# Patient Record
Sex: Male | Born: 1975 | Race: White | Hispanic: No | Marital: Married | Smoking: Former smoker
Health system: Southern US, Community
[De-identification: ages and names within clinical notes are randomized; demographics above are authoritative.]

## PROBLEM LIST (undated history)

## (undated) DIAGNOSIS — F419 Anxiety disorder, unspecified: Secondary | ICD-10-CM

## (undated) DIAGNOSIS — R030 Elevated blood-pressure reading, without diagnosis of hypertension: Secondary | ICD-10-CM

## (undated) DIAGNOSIS — E669 Obesity, unspecified: Secondary | ICD-10-CM

## (undated) DIAGNOSIS — I10 Essential (primary) hypertension: Secondary | ICD-10-CM

## (undated) DIAGNOSIS — F988 Other specified behavioral and emotional disorders with onset usually occurring in childhood and adolescence: Secondary | ICD-10-CM

## (undated) DIAGNOSIS — K219 Gastro-esophageal reflux disease without esophagitis: Secondary | ICD-10-CM

## (undated) DIAGNOSIS — F32A Depression, unspecified: Secondary | ICD-10-CM

## (undated) DIAGNOSIS — F329 Major depressive disorder, single episode, unspecified: Secondary | ICD-10-CM

## (undated) HISTORY — DX: Essential (primary) hypertension: I10

## (undated) HISTORY — DX: Depression, unspecified: F32.A

## (undated) HISTORY — DX: Other specified behavioral and emotional disorders with onset usually occurring in childhood and adolescence: F98.8

## (undated) HISTORY — DX: Obesity, unspecified: E66.9

## (undated) HISTORY — PX: TONSILLECTOMY AND ADENOIDECTOMY: SUR1326

## (undated) HISTORY — PX: ANKLE SURGERY: SHX546

## (undated) HISTORY — DX: Elevated blood-pressure reading, without diagnosis of hypertension: R03.0

## (undated) HISTORY — DX: Major depressive disorder, single episode, unspecified: F32.9

## (undated) HISTORY — DX: Anxiety disorder, unspecified: F41.9

## (undated) HISTORY — DX: Gastro-esophageal reflux disease without esophagitis: K21.9

---

## 2006-04-23 ENCOUNTER — Ambulatory Visit: Payer: Self-pay | Admitting: Family Medicine

## 2007-01-17 ENCOUNTER — Ambulatory Visit: Payer: Self-pay | Admitting: Family Medicine

## 2007-05-05 ENCOUNTER — Ambulatory Visit: Payer: Self-pay | Admitting: Internal Medicine

## 2008-02-01 DIAGNOSIS — J309 Allergic rhinitis, unspecified: Secondary | ICD-10-CM | POA: Insufficient documentation

## 2008-02-01 DIAGNOSIS — F988 Other specified behavioral and emotional disorders with onset usually occurring in childhood and adolescence: Secondary | ICD-10-CM | POA: Insufficient documentation

## 2008-02-01 DIAGNOSIS — J45909 Unspecified asthma, uncomplicated: Secondary | ICD-10-CM | POA: Insufficient documentation

## 2008-02-02 ENCOUNTER — Ambulatory Visit: Payer: Self-pay | Admitting: Family Medicine

## 2008-03-01 ENCOUNTER — Ambulatory Visit: Payer: Self-pay | Admitting: Family Medicine

## 2008-03-04 LAB — CONVERTED CEMR LAB
CO2: 32 meq/L (ref 19–32)
Calcium: 9.5 mg/dL (ref 8.4–10.5)
Eosinophils Absolute: 0.1 10*3/uL (ref 0.0–0.6)
Eosinophils Relative: 1.5 % (ref 0.0–5.0)
Glucose, Bld: 84 mg/dL (ref 70–99)
HCT: 48 % (ref 39.0–52.0)
Lymphocytes Relative: 33.5 % (ref 12.0–46.0)
MCV: 86.1 fL (ref 78.0–100.0)
Neutro Abs: 3.4 10*3/uL (ref 1.4–7.7)
Neutrophils Relative %: 57.3 % (ref 43.0–77.0)
Potassium: 4.3 meq/L (ref 3.5–5.1)
RBC: 5.58 M/uL (ref 4.22–5.81)
TSH: 1.43 microintl units/mL (ref 0.35–5.50)
WBC: 5.8 10*3/uL (ref 4.5–10.5)

## 2009-02-25 ENCOUNTER — Telehealth: Payer: Self-pay | Admitting: Family Medicine

## 2009-07-31 ENCOUNTER — Telehealth: Payer: Self-pay | Admitting: Family Medicine

## 2010-01-03 ENCOUNTER — Telehealth: Payer: Self-pay | Admitting: Family Medicine

## 2010-02-11 ENCOUNTER — Ambulatory Visit: Payer: Self-pay | Admitting: Family Medicine

## 2010-02-11 DIAGNOSIS — R030 Elevated blood-pressure reading, without diagnosis of hypertension: Secondary | ICD-10-CM | POA: Insufficient documentation

## 2010-02-14 LAB — CONVERTED CEMR LAB
Albumin: 4.6 g/dL (ref 3.5–5.2)
Alkaline Phosphatase: 85 units/L (ref 39–117)
BUN: 19 mg/dL (ref 6–23)
Basophils Absolute: 0.1 10*3/uL (ref 0.0–0.1)
Bilirubin, Direct: 0.2 mg/dL (ref 0.0–0.3)
Cholesterol: 187 mg/dL (ref 0–200)
Creatinine, Ser: 1.3 mg/dL (ref 0.4–1.5)
Eosinophils Absolute: 0.2 10*3/uL (ref 0.0–0.7)
Glucose, Bld: 83 mg/dL (ref 70–99)
HDL: 35.6 mg/dL — ABNORMAL LOW (ref >39.00)
Lymphocytes Relative: 38.3 % (ref 12.0–46.0)
MCHC: 33.6 g/dL (ref 30.0–36.0)
Monocytes Absolute: 0.3 10*3/uL (ref 0.1–1.0)
Neutrophils Relative %: 51.7 % (ref 43.0–77.0)
Phosphorus: 4.7 mg/dL — ABNORMAL HIGH (ref 2.3–4.6)
Platelets: 200 10*3/uL (ref 150.0–400.0)
Potassium: 4.2 meq/L (ref 3.5–5.1)
RDW: 12.4 % (ref 11.5–14.6)
Triglycerides: 226 mg/dL — ABNORMAL HIGH (ref 0.0–149.0)
VLDL: 45.2 mg/dL — ABNORMAL HIGH (ref 0.0–40.0)

## 2010-03-11 ENCOUNTER — Ambulatory Visit: Payer: Self-pay | Admitting: Family Medicine

## 2010-09-15 ENCOUNTER — Ambulatory Visit: Payer: Self-pay | Admitting: Family Medicine

## 2010-11-17 ENCOUNTER — Ambulatory Visit: Payer: Self-pay | Admitting: General Practice

## 2011-01-27 NOTE — Assessment & Plan Note (Signed)
Summary: FOLLOW UP / LFW   Vital Signs:  Patient profile:   35 year old male Height:      71.5 inches Weight:      287.50 pounds BMI:     39.68 Temp:     98.4 degrees F oral Pulse rate:   68 / minute Pulse rhythm:   regular BP sitting:   128 / 84  (left arm) Cuff size:   large  Vitals Entered By: Lewanda Rife LPN (September 15, 2010 3:01 PM) CC: six month f/u   History of Present Illness: here for f/u of borderline bp and depression  wt is down 12 lb since last visit with bmi of 39 is really proud of it  started with the special K diet  was down a bit more and then gained a bit    had been exercising more- is trying to keep that up  is still doing that with more weights    on zoloft for depression- still working well with good mood and wants to stick with it  stress level is a bit better   128/84 blood pressure   Allergies: 1)  ! Lexapro  Past History:  Past Medical History: Last updated: 02/01/2008 Allergic rhinitis Asthma Depression  Past Surgical History: Last updated: 02/01/2008 Ankle surgery Adenoidectomy Tonsillectomy  Family History: Last updated: 03/01/2008 Father: heart problems, HTN Mother:  Siblings:  sister hypothyroid   Social History: Last updated: 02/01/2008 Marital Status: Married Children:  Occupation: [employer]  Risk Factors: Smoking Status: quit (02/01/2008)  Review of Systems General:  Denies fatigue, fever, loss of appetite, and malaise. Eyes:  Denies blurring and eye irritation. CV:  Denies chest pain or discomfort, lightheadness, and palpitations. Resp:  Denies cough and shortness of breath. GI:  Denies abdominal pain and change in bowel habits. GU:  Denies dysuria and urinary frequency. MS:  Denies joint pain and cramps. Derm:  Denies itching, lesion(s), poor wound healing, and rash. Neuro:  Denies numbness and tingling. Psych:  Denies anxiety and depression. Endo:  Denies excessive thirst and excessive  urination. Heme:  Denies abnormal bruising and bleeding.   Impression & Recommendations:  Problem # 1:  ELEVATED BLOOD PRESSURE WITHOUT DIAGNOSIS OF HYPERTENSION (ICD-796.2) Assessment Improved  this is very well controlled with wt loss and exercise urged to keep up the effort   BP today: 128/84 Prior BP: 128/92 (03/11/2010)  Labs Reviewed: Creat: 1.3 (02/11/2010) Chol: 187 (02/11/2010)   HDL: 35.60 (02/11/2010)   TG: 226.0 (02/11/2010)  Instructed in low sodium diet (DASH Handout) and behavior modification.    Problem # 2:  DEPRESSION (ICD-311) Assessment: Improved  doing great with long term zoloft without change  urged to stay active as well His updated medication list for this problem includes:    Zoloft 50 Mg Tabs (Sertraline hcl) .Marland Kitchen... 1 by mouth once daily  Orders: Prescription Created Electronically [phone])  Complete Medication List: 1)  Zoloft 50 Mg Tabs (Sertraline hcl) .Marland Kitchen.. 1 by mouth once daily 2)  Proventil Hfa 108 (90 Base) Mcg/act Aers (Albuterol sulfate) .... 2 puffs q 4 hours as needed wheeze 3)  Aspirin 81 Mg Tbec (Aspirin) .Marland Kitchen.. 1 by mouth once daily 4)  Advil 200 Mg Tabs (Ibuprofen) .... Otc as directed.  Patient Instructions: 1)  keep up the great work with diet and exercise--keep up the weight loss 2)  no change in medicine  3)  blood pressure is good  Prescriptions: PROVENTIL HFA 108 (90 BASE) MCG/ACT  AERS (  ALBUTEROL SULFATE) 2 puffs q 4 hours as needed wheeze  #3 mdi x 3   Entered and Authorized by:   Judith Part MD   Signed by:   Judith Part MD on 09/15/2010   Method used:   Print then Give to Patient   RxID:   [id number] ZOLOFT 50 MG  TABS (SERTRALINE HCL) 1 by mouth once daily  #90 x 3   Entered and Authorized by:   Judith Part MD   Signed by:   Judith Part MD on 09/15/2010   Method used:   Print then Give to Patient   RxID:   [id number]   Current Allergies (reviewed today): ! LEXAPRO

## 2011-01-27 NOTE — Progress Notes (Signed)
Summary: refill request for sertraline  Phone Note Refill Request Message from:  [employer]  Refills Requested: Medication #1:  ZOLOFT 50 MG  TABS 1 by mouth once daily   Last Refilled: 12/05/2009 Electronic refill request from [facility].  Initial call taken by: Lowella Petties CMA,  January 03, 2010 12:38 PM  Follow-up for Phone Call        need to schedule f/u this winter please  px written on EMR for call in  Follow-up by: Judith Part MD,  January 03, 2010 1:29 PM  Additional Follow-up for Phone Call Additional follow up Details #1::        [facility] advising pt that he needs to schedule appt. Additional Follow-up by: Lowella Petties CMA,  January 03, 2010 2:18 PM    Prescriptions: ZOLOFT 50 MG  TABS (SERTRALINE HCL) 1 by mouth once daily  #30 x 1   Entered and Authorized by:   Judith Part MD   Signed by:   Lowella Petties CMA on 01/03/2010   Method used:   Telephoned to ...         RxID:   [id number]

## 2011-01-27 NOTE — Assessment & Plan Note (Signed)
Summary: ONE MONTH F/U TO CK BP AGAIN / LFW   Vital Signs:  Patient profile:   35 year old male Height:      71.5 inches Weight:      299.75 pounds BMI:     41.37 Temp:     98.8 degrees F oral Pulse rate:   72 / minute Pulse rhythm:   regular BP sitting:   128 / 92  (left arm) Cuff size:   large  Vitals Entered By: Lewanda Rife LPN (March 11, 2010 3:17 PM)  Serial Vital Signs/Assessments:  Time      Position  BP       Pulse  Resp  Temp     By                     161/09                         Judith Part MD   History of Present Illness: here to check bp for elevation at last visit is doing well -- feeling good   lots of exercise  20 min of elliptical with sprints and then 20 with bike and jog 10  50 min of cardio 4-5 days per week  then does wts   taking notches down his belt - even though not much wt lost on scale  eating a lot more veg and fruit   wt is down 3 lb  bp today is improved at 128/92 on first check  did labs  chol mildly high with LDL 132  mood is very good   Allergies: 1)  ! Lexapro  Past History:  Past Medical History: Last updated: 02/01/2008 Allergic rhinitis Asthma Depression  Past Surgical History: Last updated: 02/01/2008 Ankle surgery Adenoidectomy Tonsillectomy  Family History: Last updated: 03/01/2008 Father: heart problems, HTN Mother:  Siblings:  sister hypothyroid   Social History: Last updated: 02/01/2008 Marital Status: Married Children:  Occupation: [employer]  Risk Factors: Smoking Status: quit (02/01/2008)  Review of Systems General:  Denies fatigue, loss of appetite, malaise, and sleep disorder. Eyes:  Denies blurring and eye irritation. CV:  Denies chest pain or discomfort, palpitations, and shortness of breath with exertion. Resp:  Denies cough and wheezing. GI:  Denies abdominal pain, bloody stools, change in bowel habits, indigestion, nausea, and vomiting. GU:  Denies urinary frequency. MS:  Denies  muscle aches. Derm:  Denies lesion(s), poor wound healing, and rash. Neuro:  Denies headaches, numbness, and tingling. Psych:  Denies anxiety and depression. Endo:  Denies cold intolerance and heat intolerance. Heme:  Denies abnormal bruising and bleeding.  Physical Exam  General:  overweight but generally well appearing  muscular build with some wt loss since last visit  Head:  normocephalic, atraumatic, and no abnormalities observed.   Eyes:  vision grossly intact, pupils equal, pupils round, and pupils reactive to light.  no conjunctival pallor, injection or icterus  Mouth:  pharynx pink and moist.   Neck:  supple with full rom and no masses or thyromegally, no JVD or carotid bruit  Lungs:  Normal respiratory effort, chest expands symmetrically. Lungs are clear to auscultation, no crackles or wheezes. Heart:  Normal rate and regular rhythm. S1 and S2 normal without gallop, murmur, click, rub or other extra sounds. Abdomen:  no renal bruits  Msk:  No deformity or scoliosis noted of thoracic or lumbar spine.   Extremities:  No clubbing, cyanosis, edema, or deformity  noted with normal full range of motion of all joints.   Neurologic:  sensation intact to light touch, gait normal, and DTRs symmetrical and normal.  no tremor  Skin:  Intact without suspicious lesions or rashes Cervical Nodes:  No lymphadenopathy noted Psych:  bright affect- talkative and cheerful today   Impression & Recommendations:  Problem # 1:  ELEVATED BLOOD PRESSURE WITHOUT DIAGNOSIS OF HYPERTENSION (ICD-796.2) Assessment Improved  improved and even better on second check today with large cuff at rest disc healthy diet (low simple sugar/ choose complex carbs/ low sat fat) diet and exercise in detail  commended on efforts and wt loss so far  f/u 6 mo   BP today: 128/92- re check 122/85 Prior BP: 138/92 (02/11/2010)  Labs Reviewed: Creat: 1.3 (02/11/2010) Chol: 187 (02/11/2010)   HDL: 35.60 (02/11/2010)    TG: 226.0 (02/11/2010)  Instructed in low sodium diet (DASH Handout) and behavior modification.    Problem # 2:  DEPRESSION (ICD-311) Assessment: Improved much imp with low dose zoloft and exercise will work to keep up good habits - update if worse  f/u 6 mo  His updated medication list for this problem includes:    Zoloft 50 Mg Tabs (Sertraline hcl) .Marland Kitchen... 1 by mouth once daily  Complete Medication List: 1)  Zoloft 50 Mg Tabs (Sertraline hcl) .Marland Kitchen.. 1 by mouth once daily 2)  Proventil Hfa 108 (90 Base) Mcg/act Aers (Albuterol sulfate) .... 2 puffs q 4 hours as needed wheeze 3)  Aspirin 81 Mg Tbec (Aspirin) .Marland Kitchen.. 1 by mouth once daily  Patient Instructions: 1)  keep working on healthy diet and weight loss 2)  blood pressure is better today  3)  watch sodium in diet  4)  follow up with me in about 6 months  Current Allergies (reviewed today): ! LEXAPRO

## 2011-01-27 NOTE — Assessment & Plan Note (Signed)
Summary: FOLLOW UP ON MEDS/[facility]H   Vital Signs:  Patient profile:   35 year old male Height:      71.5 inches Weight:      302.50 pounds BMI:     41.75 Temp:     97.9 degrees F oral Pulse rate:   76 / minute Pulse rhythm:   regular BP sitting:   138 / 92  (left arm) Cuff size:   large  Vitals Entered By: Lewanda Rife LPN (February 11, 2010 3:21 PM)  Serial Vital Signs/Assessments:  Time      Position  BP       Pulse  Resp  Temp     By                     138/90                         Judith Part MD   History of Present Illness: here for f/u of mood disorder  wt is up 20 lb this visit   wife and self got laid off -- this year -- was really hard now have new jobs   he was working at [employer]  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 if it is stress related   works out 4-5 days per week for 11/2 hours  doing machines - in a circuit / rides the bike 20 min / bike 10 / elliptical for 10 (not high intensity)   breakfast - protien bar  lunch - can of soup and pbj sandwhich supper - chicken or tacos   (bigger serving for supper)  snack before 8 am -- sugar free popcicles  1-2 gallons of water per day   no sweets or junk or fast food or pizza  no fruit  starting to eat vegetables -- broccoli / calliflauer and squash   zoloft is working very well - wants to stay on it  not a lot of side effects     Allergies: 1)  ! Lexapro  Past History:  Past Medical History: Last updated: 02/01/2008 Allergic rhinitis Asthma Depression  Past Surgical History: Last updated: 02/01/2008 Ankle surgery Adenoidectomy Tonsillectomy  Family History: Last updated: 03/01/2008 Father: heart problems, HTN Mother:  Siblings:  sister hypothyroid   Social History: Last updated: 02/01/2008 Marital Status: Married Children:  Occupation: [employer]  Risk Factors: Smoking Status: quit  (02/01/2008)  Review of Systems General:  Complains of fatigue; denies loss of appetite, malaise, and sleep disorder. Eyes:  Denies blurring and eye irritation. CV:  Denies chest pain or discomfort, lightheadness, and palpitations. Resp:  Denies cough and wheezing. GI:  Denies abdominal pain, change in bowel habits, and indigestion. GU:  Denies dysuria, nocturia, urinary frequency, and urinary hesitancy. MS:  Denies joint pain, joint redness, and joint swelling. Derm:  Denies itching, lesion(s), poor wound healing, and rash. Neuro:  Denies numbness and tingling. Psych:  Denies anxiety and depression. Endo:  Denies cold intolerance, excessive thirst, excessive urination, and heat intolerance. Heme:  Denies abnormal bruising and bleeding.  Physical Exam  General:  overweight but generally well appearing wt gain noted very muscular build  Head:  normocephalic, atraumatic, and no abnormalities observed.   Eyes:  vision grossly intact, pupils equal, pupils round, and pupils reactive to light.  no conjunctival pallor, injection or icterus  Mouth:  pharynx pink and moist.   Neck:  supple with full rom and no masses or thyromegally, no JVD or carotid bruit  Chest Wall:  No deformities, masses, tenderness or gynecomastia noted. Lungs:  Normal respiratory effort, chest expands symmetrically. Lungs are clear to auscultation, no crackles or wheezes. Heart:  Normal rate and regular rhythm. S1 and S2 normal without gallop, murmur, click, rub or other extra sounds. Abdomen:  Bowel sounds positive,abdomen soft and non-tender without masses, organomegaly or hernias noted. no renal bruits  Msk:  No deformity or scoliosis noted of thoracic or lumbar spine.  no acute joint changes  Pulses:  R and L carotid,radial,femoral,dorsalis pedis and posterior tibial pulses are full and equal bilaterally Extremities:  No clubbing, cyanosis, edema, or deformity noted with normal full range of motion of all joints.     Neurologic:  sensation intact to light touch, gait normal, and DTRs symmetrical and normal.  no tremor  Skin:  Intact without suspicious lesions or rashes Cervical Nodes:  No lymphadenopathy noted Psych:  normal affect, talkative and pleasant    Impression & Recommendations:  Problem # 1:  DEPRESSION (ICD-311) Assessment Improved  very well controlled with zoloft - despite ups and downs with stress refilled and sent to pharmacy His updated medication list for this problem includes:    Zoloft 50 Mg Tabs (Sertraline hcl) .Marland Kitchen... 1 by mouth once daily  Orders: Prescription Created Electronically [phone])  Problem # 2:  ELEVATED BLOOD PRESSURE WITHOUT DIAGNOSIS OF HYPERTENSION (ICD-796.2) Assessment: New along with wt gain in pt with family hx  disc lifestyle changes in detail handouts given from [facility] on HTN  will work on exercise/wt loss  f/u 1 mo  consider tx if not imp lab today Orders: Venipuncture (98119) TLB-Lipid Panel (80061-LIPID) TLB-Renal Function Panel (80069-RENAL) TLB-CBC Platelet - w/Differential (85025-CBCD) TLB-Hepatic/Liver Function Pnl (80076-HEPATIC) TLB-TSH (Thyroid Stimulating Hormone) (84443-TSH)  Complete Medication List: 1)  Zoloft 50 Mg Tabs (Sertraline hcl) .Marland Kitchen.. 1 by mouth once daily 2)  Proventil Hfa 108 (90 Base) Mcg/act Aers (Albuterol sulfate) .... 2 puffs q 4 hours as needed wheeze 3)  Aspirin 81 Mg Tbec (Aspirin) .Marland Kitchen.. 1 by mouth once daily  Patient Instructions: 1)  work on weight loss - consider online weight watchers for men or nutra system  2)  increase intensity of you cardio for at least 30 min  3)  watch sodium in your diet- avoid processed foods  4)  labs today  5)  no change in zoloft  6)  follow up with me in 1 month - to check blood pressure again  Prescriptions: ZOLOFT 50 MG  TABS (SERTRALINE HCL) 1 by mouth once daily  #30 x 11   Entered and Authorized by:   Judith Part MD   Signed by:   Judith Part MD on 02/11/2010    Method used:   Electronically to        [facility]       [address]       [place], [place]  [address]       Ph: [phone]       Fax: [phone]   RxID:   [id number]   Current Allergies (reviewed today): ! LEXAPRO

## 2011-09-15 ENCOUNTER — Other Ambulatory Visit: Payer: Self-pay

## 2011-09-15 MED ORDER — SERTRALINE HCL 50 MG PO TABS: 50.0000 mg | ORAL_TABLET | Freq: Every day | ORAL | Status: DC

## 2011-09-15 NOTE — Telephone Encounter (Signed)
[facility] pharmacy faxed refill request Sertraline 50mg  taking 1 tab daily. Was in centricity and I added to Epic med list. Last refill 06/10/11.Please advise. [facility] pharmacy phone [phone] and fax [phone].

## 2011-09-15 NOTE — Telephone Encounter (Signed)
Medication phoned to [facility] pharmacy as instructed with note pt to call for f/u this fall.

## 2011-09-15 NOTE — Telephone Encounter (Signed)
Schedule f/u this fall Px written for call in

## 2011-11-13 ENCOUNTER — Encounter: Payer: Self-pay | Admitting: Family Medicine

## 2011-11-16 ENCOUNTER — Encounter: Payer: Self-pay | Admitting: Family Medicine

## 2011-11-16 ENCOUNTER — Ambulatory Visit (INDEPENDENT_AMBULATORY_CARE_PROVIDER_SITE_OTHER): Payer: PRIVATE HEALTH INSURANCE | Admitting: Family Medicine

## 2011-11-16 VITALS — BP 134/88 | HR 68 | Temp 98.3°F | Ht 71.5 in | Wt 299.0 lb

## 2011-11-16 DIAGNOSIS — F3289 Other specified depressive episodes: Secondary | ICD-10-CM

## 2011-11-16 DIAGNOSIS — R5383 Other fatigue: Secondary | ICD-10-CM | POA: Insufficient documentation

## 2011-11-16 DIAGNOSIS — R5381 Other malaise: Secondary | ICD-10-CM

## 2011-11-16 DIAGNOSIS — F329 Major depressive disorder, single episode, unspecified: Secondary | ICD-10-CM

## 2011-11-16 DIAGNOSIS — R635 Abnormal weight gain: Secondary | ICD-10-CM

## 2011-11-16 MED ORDER — FLUOXETINE HCL 20 MG PO CAPS: 20.0000 mg | ORAL_CAPSULE | Freq: Every day | ORAL | Status: DC

## 2011-11-16 NOTE — Progress Notes (Signed)
Subjective:    Patient ID: Samuel Murray, male    DOB: Sep 14, 1976, 35 y.o.   MRN: [id number]  HPI Here for f/u of depression on zoloft  Has been ok in general    bp today is 134/88-has been mildly elevated in past Also wt is up 12 lb from last visit  He is very concerned about this  Tried weight watchers  This did not work -- this was actually too many points for him  Lost 4 lb total and then gained it back without any changes  Works out 45 min cardio at least 4 days per week   Energy is really low - even though he is in good shape   Not worried about sleep apnea- not snoring and gets good rest   Mood is good and started taking zoloft at night -- been on it for years  Wants to change it to see if it has been too sedating  He does have anxiety at all   Has had 3 thyroid tests -- and all is normal Is interested in seeing endocrinologist  Last blood profile 6 months ago  No one in family with endo problems     Keeping servings very small  Not eating junk food   Patient Active Problem List  Diagnoses  . OBESITY  . DEPRESSION  . ADD  . ALLERGIC RHINITIS  . ASTHMA  . ELEVATED BLOOD PRESSURE WITHOUT DIAGNOSIS OF HYPERTENSION  . Weight gain  . Fatigue   Past Medical History  Diagnosis Date  . Allergic rhinitis   . Asthma   . Depression   . Attention deficit disorder 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   . Elevated blood pressure reading without diagnosis of hypertension   . Obesity, unspecified    Past Surgical History  Procedure Date  . Ankle surgery   . Tonsillectomy and adenoidectomy    History  Substance Use Topics  . Smoking status: Former Smoker    Quit date: 12/28/2000  . Smokeless tobacco: Not on file  . Alcohol Use: Not on file   Family History  Problem Relation Age of Onset  . Hypertension Father   . Other Father     heart problems  . Heart disease Father   . Hypothyroidism Sister   . Thyroid disease Sister     hypothyroid   Allergies    Allergen Reactions  . Escitalopram Oxalate     REACTION: sedation   Current Outpatient Prescriptions on File Prior to Visit  Medication Sig Dispense Refill  . albuterol (PROVENTIL HFA;VENTOLIN HFA) 108 (90 BASE) MCG/ACT inhaler Inhale 2 puffs into the lungs every 4 (four) hours as needed.        Marland Kitchen aspirin 81 MG tablet Take 81 mg by mouth daily.        Marland Kitchen ibuprofen (ADVIL,MOTRIN) 200 MG tablet Take otc as directed.               Review of Systems Review of Systems  Constitutional: Negative for fever, appetite change, pos for fatigue and increase in weight  Eyes: Negative for pain and visual disturbance.  Respiratory: Negative for cough and shortness of breath.   Cardiovascular: Negative for cp or palpitations    Gastrointestinal: Negative for nausea, diarrhea and constipation.  Genitourinary: Negative for urgency and frequency.  Skin: Negative for pallor or rash   Neurological: Negative for weakness, light-headedness, numbness and headaches.  Hematological: Negative for adenopathy. Does not bruise/bleed easily.  Psychiatric/Behavioral: depression and anxiety are  in good control with some med side eff         Objective:   Physical Exam  Constitutional: He appears well-developed and well-nourished. No distress.       overwt and well appearing   HENT:  Head: Normocephalic and atraumatic.  Right Ear: External ear normal.  Left Ear: External ear normal.  Nose: Nose normal.  Mouth/Throat: Oropharynx is clear and moist.  Eyes: Conjunctivae and EOM are normal. Pupils are equal, round, and reactive to light. No scleral icterus.  Neck: Normal range of motion. Neck supple. No JVD present. Carotid bruit is not present. No thyromegaly present.  Cardiovascular: Normal rate, regular rhythm, normal heart sounds and intact distal pulses.  Exam reveals no gallop.   Pulmonary/Chest: Effort normal and breath sounds normal. No respiratory distress. He has no wheezes.  Abdominal: Soft. Bowel  sounds are normal. He exhibits no distension, no abdominal bruit and no mass. There is no tenderness.  Musculoskeletal: He exhibits no edema and no tenderness.  Lymphadenopathy:    He has no cervical adenopathy.  Neurological: He is alert. He has normal reflexes. He displays no atrophy and no tremor. No cranial nerve deficit. He exhibits normal muscle tone. Coordination normal.  Skin: Skin is warm and dry. No rash noted. No erythema. No pallor.  Psychiatric: He has a normal mood and affect.          Assessment & Plan:

## 2011-11-16 NOTE — Patient Instructions (Signed)
Stop zoloft and try prozac 20 mg  If any problems -let me know  Keep up the great diet and exercise  Labs today If no improvement in energy and weight in 1 month- let me know so we can do an endocrine referral  Follow up with me in about 3 months

## 2011-11-17 LAB — TSH: TSH: 1.41 u[IU]/mL (ref 0.35–5.50)

## 2011-11-17 LAB — CBC WITH DIFFERENTIAL/PLATELET
Basophils Relative: 0.3 % (ref 0.0–3.0)
Eosinophils Absolute: 0.1 10*3/uL (ref 0.0–0.7)
HCT: 45.2 % (ref 39.0–52.0)
Hemoglobin: 15.5 g/dL (ref 13.0–17.0)
MCHC: 34.4 g/dL (ref 30.0–36.0)
MCV: 87.9 fl (ref 78.0–100.0)
Monocytes Absolute: 0.4 10*3/uL (ref 0.1–1.0)
Neutro Abs: 3.8 10*3/uL (ref 1.4–7.7)
Neutrophils Relative %: 57 % (ref 43.0–77.0)
RBC: 5.14 Mil/uL (ref 4.22–5.81)
RDW: 13.1 % (ref 11.5–14.6)

## 2011-11-17 LAB — COMPREHENSIVE METABOLIC PANEL
ALT: 52 U/L (ref 0–53)
AST: 32 U/L (ref 0–37)
Calcium: 9.2 mg/dL (ref 8.4–10.5)
Chloride: 105 mEq/L (ref 96–112)
Creatinine, Ser: 1.3 mg/dL (ref 0.4–1.5)
Sodium: 143 mEq/L (ref 135–145)
Total Protein: 7.7 g/dL (ref 6.0–8.3)

## 2011-11-23 NOTE — Assessment & Plan Note (Signed)
Ongoing despite good diet/ restricted calories and diligent exercise  Will change ssri to prozac and see if imp If not will consider ref to endocrine for this and fatigue

## 2011-11-23 NOTE — Assessment & Plan Note (Signed)
This has been well controlled on zoloft but pt is concerned that this is making him tired and gain wt Will change to prozac and see if any imp  If not or if worsened depression - pt will call

## 2011-11-23 NOTE — Assessment & Plan Note (Signed)
Ongoing despite good sleep/ exercise and well controlled depression  Will see if a side eff of zoloft - and change to prozac If not imp in 1 mo -consider endo ref for this and wt gain

## 2011-11-30 ENCOUNTER — Telehealth: Payer: Self-pay | Admitting: Internal Medicine

## 2011-11-30 NOTE — Telephone Encounter (Signed)
Patient called and stated that he was switched from Zoloft to Prozac and Prozac is making him gain weight so he would like to switch back to Zoloft.  Please advise.

## 2011-12-01 MED ORDER — SERTRALINE HCL 50 MG PO TABS: 50.0000 mg | ORAL_TABLET | Freq: Every day | ORAL | Status: DC

## 2011-12-01 NOTE — Telephone Encounter (Signed)
Left v/m at all contact #s for pt to call back. Need to verify which pharmacy pt wants med called to.

## 2011-12-01 NOTE — Telephone Encounter (Signed)
Will go back to zoloft 50  surprising - prozac usually does the opposite - but it was worth a try  prostate cancer

## 2011-12-01 NOTE — Telephone Encounter (Signed)
Patient notified as instructed by telephone. Medication phoned to [facility] pharmacy as instructed. Prozac d/c.

## 2011-12-02 ENCOUNTER — Telehealth: Payer: Self-pay | Admitting: Family Medicine

## 2011-12-02 NOTE — Telephone Encounter (Signed)
Pt called need to give information regarding a referral, says Dr. Royden Purl CMA told him to call and give the last physician he seen for GI problmes..Left message..Samuel Murray

## 2011-12-04 ENCOUNTER — Telehealth: Payer: Self-pay | Admitting: Internal Medicine

## 2011-12-04 DIAGNOSIS — R5383 Other fatigue: Secondary | ICD-10-CM

## 2011-12-04 DIAGNOSIS — R635 Abnormal weight gain: Secondary | ICD-10-CM

## 2011-12-04 NOTE — Telephone Encounter (Signed)
Will refer for fatigue and wt gain

## 2011-12-04 NOTE — Telephone Encounter (Signed)
Patient called back with the endocrinologist he would like to be referred it is Dr. Wendall Mola at [facility].

## 2012-01-11 ENCOUNTER — Other Ambulatory Visit: Payer: Self-pay

## 2012-01-11 LAB — CREATININE, URINE, 24 HOUR
Collection Hours: 24 hours
Total Volume: 1800 mL

## 2012-02-03 ENCOUNTER — Other Ambulatory Visit: Payer: Self-pay | Admitting: Family Medicine

## 2012-02-04 NOTE — Telephone Encounter (Signed)
Spoke with Inetta Fermo at [facility] and pt has 1 yr of refill at pharmacy already. Inetta Fermo said would have #90 of Sertraline ready for pick up this afternoon. Left v/m for pt to call back.

## 2012-02-04 NOTE — Telephone Encounter (Signed)
Patient notified as instructed by telephone. 

## 2013-02-03 ENCOUNTER — Ambulatory Visit (INDEPENDENT_AMBULATORY_CARE_PROVIDER_SITE_OTHER): Payer: 59 | Admitting: Family Medicine

## 2013-02-03 ENCOUNTER — Encounter: Payer: Self-pay | Admitting: Family Medicine

## 2013-02-03 VITALS — BP 126/82 | HR 64 | Temp 98.5°F | Ht 71.5 in | Wt 295.5 lb

## 2013-02-03 DIAGNOSIS — K219 Gastro-esophageal reflux disease without esophagitis: Secondary | ICD-10-CM

## 2013-02-03 DIAGNOSIS — F329 Major depressive disorder, single episode, unspecified: Secondary | ICD-10-CM

## 2013-02-03 DIAGNOSIS — F3289 Other specified depressive episodes: Secondary | ICD-10-CM

## 2013-02-03 DIAGNOSIS — E669 Obesity, unspecified: Secondary | ICD-10-CM

## 2013-02-03 DIAGNOSIS — J309 Allergic rhinitis, unspecified: Secondary | ICD-10-CM

## 2013-02-03 MED ORDER — SERTRALINE HCL 50 MG PO TABS: 50.0000 mg | ORAL_TABLET | Freq: Every day | ORAL | Status: DC

## 2013-02-03 MED ORDER — FLUTICASONE PROPIONATE 50 MCG/ACT NA SUSP: 2.0000 | Freq: Every day | NASAL | Status: DC

## 2013-02-03 MED ORDER — LANSOPRAZOLE 30 MG PO CPDR: 30.0000 mg | DELAYED_RELEASE_CAPSULE | Freq: Every day | ORAL | Status: DC

## 2013-02-03 NOTE — Patient Instructions (Signed)
Medicines sent to your pharmacy  Take good care of yourself with diet and exercise

## 2013-02-03 NOTE — Progress Notes (Signed)
Subjective:    Patient ID: Samuel Murray, male    DOB: 04/03/1976, 37 y.o.   MRN: [id number]  HPI Is doing about the same   A lot of life changes  Was bought out by [employer] Is trying to sell a house  A lot of stress   Is needing med refills Is taking care of himself   Flu shot this year - oct   Mood has been pretty stable in light of the stress  zoloft still helps  Concentration is ok   Needs a refil of flonase for his allergies  Had one bad uri this year - and allergy season is coming   GERD-is still problematic  Still feels like he needs to clear his throat all the time  Takes prevacid once daily 15mg  -- will need to inc dose  Needs 30 mg   Diet- not optimal for gerd but does not drink caff and soft drink occ spicy foods  Grows really hot peppers in garden   He sees endocrinology - for weight loss  Was on phenterimine - did loose from 320 to 285 - did not gain it all back  Patient Active Problem List  Diagnosis  . OBESITY  . DEPRESSION  . ADD  . ALLERGIC RHINITIS  . ASTHMA  . ELEVATED BLOOD PRESSURE WITHOUT DIAGNOSIS OF HYPERTENSION  . Weight gain  . Fatigue   Past Medical History  Diagnosis Date  . Allergic rhinitis   . Asthma   . Depression   . Attention deficit disorder 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   . Elevated blood pressure reading without diagnosis of hypertension   . Obesity, unspecified    Past Surgical History  Procedure Date  . Ankle surgery   . Tonsillectomy and adenoidectomy    History  Substance Use Topics  . Smoking status: Former Smoker    Quit date: 12/28/2000  . Smokeless tobacco: Not on file  . Alcohol Use: No   Family History  Problem Relation Age of Onset  . Hypertension Father   . Other Father     heart problems  . Heart disease Father   . Hypothyroidism Sister   . Thyroid disease Sister     hypothyroid   Allergies  Allergen Reactions  . Escitalopram Oxalate     REACTION: sedation  . Prozac (Fluoxetine Hcl)      Wt gain    Current Outpatient Prescriptions on File Prior to Visit  Medication Sig Dispense Refill  . albuterol (PROVENTIL HFA;VENTOLIN HFA) 108 (90 BASE) MCG/ACT inhaler Inhale 2 puffs into the lungs every 4 (four) hours as needed.        Marland Kitchen ibuprofen (ADVIL,MOTRIN) 200 MG tablet Take otc as directed.      . Lansoprazole (PREVACID SOLUTAB PO) Take by mouth as directed.        . Naproxen Sodium (ALEVE) 220 MG CAPS Take 1 capsule by mouth 2 (two) times daily.        . sertraline (ZOLOFT) 50 MG tablet Take 1 tablet (50 mg total) by mouth daily.  30 tablet  11        Review of Systems Review of Systems  Constitutional: Negative for fever, appetite change, fatigue and unexpected weight change. pos for obesity and struggle to loose weight  Eyes: Negative for pain and visual disturbance.  ENT pos for cong and rhinorrhea from his allergies  Respiratory: Negative for cough and shortness of breath.   Cardiovascular: Negative for cp or palpitations  Gastrointestinal: Negative for nausea, diarrhea and constipation. pos for heartburn/neg for abd pain or blood in stool or dark stool Genitourinary: Negative for urgency and frequency.  Skin: Negative for pallor or rash   Neurological: Negative for weakness, light-headedness, numbness and headaches.  Hematological: Negative for adenopathy. Does not bruise/bleed easily.  Psychiatric/Behavioral: Negative for dysphoric mood. Anxiety is well controlled with medication        Objective:   Physical Exam  Constitutional: He appears well-developed and well-nourished. No distress.  obese and well appearing   HENT:  Head: Normocephalic and atraumatic.  Mouth/Throat: Oropharynx is clear and moist.  Eyes: Conjunctivae and EOM are normal. Pupils are equal, round, and reactive to light. Right eye exhibits no discharge. Left eye exhibits no discharge. No scleral icterus.  Neck: Normal range of motion. Neck supple. No JVD present. Carotid bruit is not  present. No thyromegaly present.  Cardiovascular: Normal rate, regular rhythm, normal heart sounds and intact distal pulses.  Exam reveals no gallop.   Pulmonary/Chest: Effort normal and breath sounds normal. No respiratory distress. He has no wheezes.  Abdominal: Soft. Bowel sounds are normal. He exhibits no distension, no abdominal bruit and no mass. There is no tenderness.  Musculoskeletal: He exhibits no edema and no tenderness.  Lymphadenopathy:    He has no cervical adenopathy.  Neurological: He is alert. He has normal reflexes. No cranial nerve deficit. He exhibits normal muscle tone. Coordination normal.  Skin: Skin is warm and dry. No rash noted. No erythema. No pallor.  Psychiatric: He has a normal mood and affect.          Assessment & Plan:

## 2013-02-03 NOTE — Assessment & Plan Note (Signed)
Refilled flonase  

## 2013-02-05 DIAGNOSIS — K219 Gastro-esophageal reflux disease without esophagitis: Secondary | ICD-10-CM | POA: Insufficient documentation

## 2013-02-05 NOTE — Assessment & Plan Note (Signed)
Dep and anxiety are well controlled by zoloft Also stressed imp of exercise Doing well given a lot of stress and life changes

## 2013-02-05 NOTE — Assessment & Plan Note (Signed)
Some success with wt loss after use of phenteramine and very very low calorie diet He will continue to see endocrinology for this Work up was neg Will work on exercise

## 2013-02-05 NOTE — Assessment & Plan Note (Signed)
Prevacid 15 mg no longer working well / with breakthrough heartburn  Will increase to 30 mg daily in am  Continue working on wt loss as well Disc reflux diet

## 2013-07-02 ENCOUNTER — Emergency Department: Payer: Self-pay | Admitting: Emergency Medicine

## 2013-08-05 ENCOUNTER — Ambulatory Visit: Payer: Self-pay | Admitting: Orthopedic Surgery

## 2013-11-02 ENCOUNTER — Other Ambulatory Visit: Payer: Self-pay

## 2014-02-12 ENCOUNTER — Other Ambulatory Visit: Payer: Self-pay | Admitting: Family Medicine

## 2014-02-12 NOTE — Telephone Encounter (Signed)
Please schedule f/u in the spring and refill until then  

## 2014-02-12 NOTE — Telephone Encounter (Signed)
Electronic refill request, no recent/future appt. please advise

## 2014-02-14 NOTE — Telephone Encounter (Signed)
appt scheduled for 02/26/14 (per pt request) and meds refilled once

## 2014-02-26 ENCOUNTER — Ambulatory Visit: Payer: 59 | Admitting: Family Medicine

## 2014-02-27 ENCOUNTER — Encounter: Payer: Self-pay | Admitting: Family Medicine

## 2014-02-27 ENCOUNTER — Ambulatory Visit (INDEPENDENT_AMBULATORY_CARE_PROVIDER_SITE_OTHER): Payer: 59 | Admitting: Family Medicine

## 2014-02-27 VITALS — BP 118/82 | HR 80 | Temp 98.1°F | Ht 71.5 in | Wt 299.0 lb

## 2014-02-27 DIAGNOSIS — K219 Gastro-esophageal reflux disease without esophagitis: Secondary | ICD-10-CM

## 2014-02-27 DIAGNOSIS — F3289 Other specified depressive episodes: Secondary | ICD-10-CM

## 2014-02-27 DIAGNOSIS — E669 Obesity, unspecified: Secondary | ICD-10-CM

## 2014-02-27 DIAGNOSIS — J309 Allergic rhinitis, unspecified: Secondary | ICD-10-CM

## 2014-02-27 DIAGNOSIS — J45909 Unspecified asthma, uncomplicated: Secondary | ICD-10-CM

## 2014-02-27 DIAGNOSIS — F329 Major depressive disorder, single episode, unspecified: Secondary | ICD-10-CM

## 2014-02-27 MED ORDER — SERTRALINE HCL 50 MG PO TABS: ORAL_TABLET | ORAL | Status: DC

## 2014-02-27 MED ORDER — LANSOPRAZOLE 30 MG PO CPDR: DELAYED_RELEASE_CAPSULE | ORAL | Status: DC

## 2014-02-27 MED ORDER — FLUTICASONE PROPIONATE 50 MCG/ACT NA SUSP: 2.0000 | Freq: Every day | NASAL | Status: DC

## 2014-02-27 MED ORDER — ALBUTEROL SULFATE HFA 108 (90 BASE) MCG/ACT IN AERS: 2.0000 | INHALATION_SPRAY | RESPIRATORY_TRACT | Status: DC | PRN

## 2014-02-27 NOTE — Progress Notes (Signed)
Subjective:    Patient ID: Samuel CollumWilliam H Murray, male    DOB: 11/13/1976, 38 y.o.   MRN: [id number]  HPI Here for f/u of chronic medical problems  Needs med refills   Feeling good  Tries to take care of himself   Wt is up 4 lb with bmi of 41  He has struggled with that He still exercises regularly  Last Td - was 2-3 years ago   Wt is up 4lb with bmi of 41   Has been very busy Starting a new job- excited about that  Moving and building a house   Mood/depression On zoloft- working well and feels he needs to continue it  Mood is good/ not irritable - and rolling with the changes in life   GERD- on prevacid  That works well - has to take daily   Will need flonase and ventolin for allergy season   Patient Active Problem List   Diagnosis Date Noted  . GERD (gastroesophageal reflux disease) 02/05/2013  . OBESITY 03/01/2008  . DEPRESSION 02/01/2008  . ADD 02/01/2008  . ALLERGIC RHINITIS 02/01/2008  . ASTHMA 02/01/2008   Past Medical History  Diagnosis Date  . Allergic rhinitis   . Asthma   . Depression   . Attention deficit disorder 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   . Elevated blood pressure reading without diagnosis of hypertension   . Obesity, unspecified    Past Surgical History  Procedure Laterality Date  . Ankle surgery    . Tonsillectomy and adenoidectomy     History  Substance Use Topics  . Smoking status: Former Smoker    Quit date: 12/28/2000  . Smokeless tobacco: Not on file  . Alcohol Use: No   Family History  Problem Relation Age of Onset  . Hypertension Father   . Other Father     heart problems  . Heart disease Father   . Hypothyroidism Sister   . Thyroid disease Sister     hypothyroid   Allergies  Allergen Reactions  . Escitalopram Oxalate     REACTION: sedation  . Prozac [Fluoxetine Hcl]     Wt gain    Current Outpatient Prescriptions on File Prior to Visit  Medication Sig Dispense Refill  . albuterol (PROVENTIL HFA;VENTOLIN HFA)  108 (90 BASE) MCG/ACT inhaler Inhale 2 puffs into the lungs every 4 (four) hours as needed.        . fluticasone (FLONASE) 50 MCG/ACT nasal spray Place 2 sprays into the nose daily.  16 g  11  . ibuprofen (ADVIL,MOTRIN) 200 MG tablet Take otc as directed.      . lansoprazole (PREVACID) 30 MG capsule Take 1 capsule (30 mg total) by mouth daily.  30 capsule  0  . sertraline (ZOLOFT) 50 MG tablet TAKE ONE TABLET ONCE DAILY  90 tablet  0   No current facility-administered medications on file prior to visit.      Review of Systems Review of Systems  Constitutional: Negative for fever, appetite change, fatigue and unexpected weight change.  Eyes: Negative for pain and visual disturbance.  Respiratory: Negative for cough and shortness of breath.   Cardiovascular: Negative for cp or palpitations    Gastrointestinal: Negative for nausea, diarrhea and constipation.  Genitourinary: Negative for urgency and frequency.  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  Hematological: Negative for adenopathy. Does not  bruise/bleed easily.  Psychiatric/Behavioral: Negative for dysphoric mood. The patient is not nervous/anxious.         Objective:   Physical Exam  Constitutional: He appears well-developed and well-nourished. No distress.  obese and well appearing   HENT:  Head: Normocephalic and atraumatic.  Mouth/Throat: Oropharynx is clear and moist.  Eyes: Conjunctivae and EOM are normal. Pupils are equal, round, and reactive to light. Right eye exhibits no discharge. Left eye exhibits no discharge. No scleral icterus.  Neck: Normal range of motion. Neck supple. No JVD present. Carotid bruit is not present. No thyromegaly present.  Cardiovascular: Normal rate, regular rhythm and intact distal pulses.  Exam reveals no gallop.   Pulmonary/Chest: Effort normal and  breath sounds normal. No respiratory distress. He has no wheezes. He exhibits no tenderness.  Abdominal: Soft. Bowel sounds are normal. He exhibits no distension, no abdominal bruit and no mass. There is no tenderness.  Musculoskeletal: He exhibits no edema.  Lymphadenopathy:    He has no cervical adenopathy.  Neurological: He is alert. He has normal reflexes. No cranial nerve deficit. He exhibits normal muscle tone. Coordination normal.  Skin: Skin is warm and dry. No rash noted. No erythema. No pallor.  Psychiatric: He has a normal mood and affect.          Assessment & Plan:

## 2014-02-27 NOTE — Progress Notes (Signed)
Pre visit review using our clinic review tool, if applicable. No additional management support is needed unless otherwise documented below in the visit note. 

## 2014-02-27 NOTE — Patient Instructions (Signed)
Take care of yourself  Keep exercising and also keep watching diet carefully  No change in medicines  Follow up for a health maintenance exam after age 38

## 2014-02-28 NOTE — Assessment & Plan Note (Signed)
Continue zoloft Pt feels he still needs it with so much life change Disc coping skills

## 2014-02-28 NOTE — Assessment & Plan Note (Signed)
Pt will continue current tx in season

## 2014-02-28 NOTE — Assessment & Plan Note (Signed)
Discussed how this problem influences overall health and the risks it imposes  Reviewed plan for weight loss with lower calorie diet (via better food choices and also portion control or program like weight watchers) and exercise building up to or more than 30 minutes 5 days per week including some aerobic activity   Pt has struggled and unable to loose wt despite diet and exercise so far

## 2014-02-28 NOTE — Assessment & Plan Note (Signed)
Refilled albuterol for prn use-= does not need often

## 2014-02-28 NOTE — Assessment & Plan Note (Signed)
Will continue prevacid - has been unable to come off of it Disc gerd diet Enc work on wt loss

## 2015-02-21 ENCOUNTER — Ambulatory Visit (INDEPENDENT_AMBULATORY_CARE_PROVIDER_SITE_OTHER): Payer: BLUE CROSS/BLUE SHIELD

## 2015-02-21 ENCOUNTER — Ambulatory Visit (INDEPENDENT_AMBULATORY_CARE_PROVIDER_SITE_OTHER): Payer: BLUE CROSS/BLUE SHIELD | Admitting: Podiatry

## 2015-02-21 ENCOUNTER — Encounter: Payer: Self-pay | Admitting: Podiatry

## 2015-02-21 VITALS — BP 143/91 | HR 66 | Resp 16 | Ht 74.0 in | Wt 290.0 lb

## 2015-02-21 DIAGNOSIS — M719 Bursopathy, unspecified: Secondary | ICD-10-CM

## 2015-02-21 DIAGNOSIS — M205X1 Other deformities of toe(s) (acquired), right foot: Secondary | ICD-10-CM

## 2015-02-21 MED ORDER — MELOXICAM 7.5 MG PO TABS: 7.5000 mg | ORAL_TABLET | Freq: Every day | ORAL | Status: DC

## 2015-02-21 NOTE — Progress Notes (Signed)
   Subjective:    Patient ID: Samuel CollumWilliam H Grygiel, male    DOB: 08/29/1976, 39 y.o.   MRN: [id number]  HPI Comments: 39 year old male presents the office today with complaints of right foot pain which has been ongoing for approximately one week. He states that he started exercising correctly 1.5 weeks ago and  Approximate one week ago he said having some pain on the outside aspect of his right foot.he states that he has pain to the area after prolonged weightbearing or standing or after exercising. He states there has been some swelling which is increased recently over the area for which she points to the fifth metatarsal head. He states he has pain with shoe gear to the area. No other complaints at this time.  Foot Pain      Review of Systems  All other systems reviewed and are negative.      Objective:   Physical Exam AAO 3, NAD DP/PT pulses palpable, CRT less than 3 seconds Protective sensation intact with Simms Weinstein monofilament, vibratory sensation intact, Achilles tendon reflex intact. There is tenderness palpation overlying the distal aspect of the right fifth metatarsal on the fifth metatarsal head, particularly laterally. There is edema overlying this area. There is a tailor's bunion deformity present with a bursa overlying the lateral fifth metatarsal head. The bursa appears to be inflamed there is a mild amount of redness overlying the area. There is no pinpoint bony tenderness or pain with vibratory sensation overlying the 5th metatarsal, or other areas of the foot/ankle. MMT 5/5, ROM WNL No open lesions or pre-ulcer lesions identified bilaterally. No pain with calf compression, swelling, warmth, erythema.     Assessment & Plan:  39 year old male with bursitis right foot. -X-rays were obtained and reviewed the patient. -Treatment options were discussed include alternatives, risks, competitions. -At this time discussed possible steroid injection into the area to help  decrease inflammation and help with pain. Risks and competitions of the injection were discussed the patient for which he understands and oblique consents to the injection. Under sterile conditions a total of 1 mL mixture of dexamethasone phosphate and 0.5% Marcaine plain was infiltrated into the area of the inflamed bursa on the lateral aspect of the fifth metatarsal head. Band-Aid was applied. Patient tolerated the injection well any compilations. Postinjection care was discussed with the patient. -Meloxicam was  Prescribed. Discussed side effects the medication directed to stop immediately if any are to occur and call the office. Do not take over-the-counter ibuprofen in conjunction with the meloxicam. -Ice to the area. -Follow-up in 2 weeks or sooner if any problems are to arise. In the meantime, encouraged to call the office with any questions, concerns, change in symptoms.

## 2015-03-05 ENCOUNTER — Ambulatory Visit (INDEPENDENT_AMBULATORY_CARE_PROVIDER_SITE_OTHER): Payer: BLUE CROSS/BLUE SHIELD

## 2015-03-05 ENCOUNTER — Ambulatory Visit (INDEPENDENT_AMBULATORY_CARE_PROVIDER_SITE_OTHER): Payer: BLUE CROSS/BLUE SHIELD | Admitting: Podiatry

## 2015-03-05 VITALS — BP 127/64 | HR 67 | Resp 18

## 2015-03-05 DIAGNOSIS — M8430XA Stress fracture, unspecified site, initial encounter for fracture: Secondary | ICD-10-CM | POA: Diagnosis not present

## 2015-03-05 DIAGNOSIS — M109 Gout, unspecified: Secondary | ICD-10-CM

## 2015-03-05 MED ORDER — COLCHICINE 0.6 MG PO TABS: 0.6000 mg | ORAL_TABLET | Freq: Every day | ORAL | Status: DC

## 2015-03-05 NOTE — Patient Instructions (Signed)

## 2015-03-06 DIAGNOSIS — M109 Gout, unspecified: Secondary | ICD-10-CM | POA: Insufficient documentation

## 2015-03-06 NOTE — Progress Notes (Signed)
Patient ID: DELNO BLAISDELL, male   DOB: 10/21/1976, 39 y.o.   MRN: [id number]  Subjective: 39 year old male presents the office they for follow-up evaluation of right foot pain and swelling. He states that since last appointment the redness the swelling has improved although it does continue to be somewhat swollen. He denies any pain to the area at this time with shoe gear pressure. He denies any red streaking or any drainage or wounds. Denies any systemic complaints as fevers, chills, nausea, vomiting.  Objective: AAO 3, NAD DP/PT pulses palpable, CRT less than 3 seconds Protective sensation intact with Simms Weinstein monofilament, Achilles tendon reflex intact. There is a tailor's bunion present bilaterally. On the right foot overlying the lateral aspect of the fifth metatarsal head there does continue to be some edema in faint erythema over the area however it does appear to be significantly improved compared to prior appointment. There is no tenderness to palpation overlying the area and there is no tenderness overlying the metatarsals of the digits. There is no pain with MTPJ range of motion. There is no areas of fluctuance or crepitus. There is no ascending cellulitis. There is no increase in warmth to the area at this time. No other areas of tenderness to bilateral lower extremities. MMT 5/5, ROM WNL No open lesions or pre-ulcer lesions identified bilaterally No pain with calf compression, swelling, warmth, erythema.  Assessment: 39 year old male follow-up valuation right foot pain, bursitis versus gout  Plan: -X-rays were obtained and reviewed with the patient. -Treatment options were discussed including alternatives, risks, complications. -At this time it does appear that the symptoms are resolving although does persist somewhat. He doesn't he has a history of gout to his left foot. This could be gout on the right foot as well. Due to concerns of gout colchicine was prescribed. Blood  work was ordered including uric acid, ESR, CRP, CBC -Follow-up in 2 weeks or sooner if any problems are to arise. In the meantime occur to call the office with any questions, concerns, change in symptoms.

## 2015-03-13 ENCOUNTER — Ambulatory Visit (INDEPENDENT_AMBULATORY_CARE_PROVIDER_SITE_OTHER): Payer: BLUE CROSS/BLUE SHIELD | Admitting: Family Medicine

## 2015-03-13 ENCOUNTER — Encounter: Payer: Self-pay | Admitting: Family Medicine

## 2015-03-13 VITALS — BP 142/98 | HR 55 | Temp 98.1°F | Wt 309.4 lb

## 2015-03-13 DIAGNOSIS — M109 Gout, unspecified: Secondary | ICD-10-CM

## 2015-03-13 DIAGNOSIS — R030 Elevated blood-pressure reading, without diagnosis of hypertension: Secondary | ICD-10-CM

## 2015-03-13 DIAGNOSIS — E669 Obesity, unspecified: Secondary | ICD-10-CM

## 2015-03-13 DIAGNOSIS — F32A Depression, unspecified: Secondary | ICD-10-CM | POA: Insufficient documentation

## 2015-03-13 DIAGNOSIS — F329 Major depressive disorder, single episode, unspecified: Secondary | ICD-10-CM

## 2015-03-13 DIAGNOSIS — I10 Essential (primary) hypertension: Secondary | ICD-10-CM | POA: Insufficient documentation

## 2015-03-13 DIAGNOSIS — F419 Anxiety disorder, unspecified: Secondary | ICD-10-CM | POA: Insufficient documentation

## 2015-03-13 DIAGNOSIS — M10079 Idiopathic gout, unspecified ankle and foot: Secondary | ICD-10-CM

## 2015-03-13 DIAGNOSIS — IMO0001 Reserved for inherently not codable concepts without codable children: Secondary | ICD-10-CM

## 2015-03-13 MED ORDER — FLUTICASONE PROPIONATE 50 MCG/ACT NA SUSP: 2.0000 | Freq: Every day | NASAL | Status: DC

## 2015-03-13 MED ORDER — SERTRALINE HCL 50 MG PO TABS: ORAL_TABLET | ORAL | Status: DC

## 2015-03-13 MED ORDER — LANSOPRAZOLE 30 MG PO CPDR: DELAYED_RELEASE_CAPSULE | ORAL | Status: DC

## 2015-03-13 NOTE — Patient Instructions (Addendum)
Stop all anti inflammatories (mobic, ibuprofen aleve) -this can increase blood pressure  Work on diet and exercise  Lab today  Follow up in 4-6 weeks for re check of blood pressure Drink more water  Eat a low purine diet for gout

## 2015-03-13 NOTE — Progress Notes (Signed)
Pre visit review using our clinic review tool, if applicable. No additional management support is needed unless otherwise documented below in the visit note. 

## 2015-03-13 NOTE — Progress Notes (Signed)
Subjective:    Patient ID: Samuel Murray, male    DOB: 04/13/1976, 39 y.o.   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 [employer]  Stress level is about the same    Wt is up 19 lb with bmi of 39 Aware he gained  Just started running on the treadmill 2 weeks ago-happy with that  Also bought a fitbit  Now cutting back and tracking what he eats     bp is up (did not just exercise) Thinks it may have been up outside of the office also  Father and sisters have HTN  BP Readings from Last 3 Encounters:  03/13/15 142/98  03/05/15 127/64  02/21/15 143/91    He is taking melocam now - for gout (with colcrys)  Gets a gout flare about once per year (sees a podiatrist)  Happens about once per year  Usually has to take colcrys only prn  Has never been on a preventative Has not had uric acid checked yet   It is better now   Still doing well on sertraline for depression   Patient Active Problem List   Diagnosis Date Noted  . Depression 03/13/2015  . Elevated blood pressure 03/13/2015  . Gout 03/06/2015  . GERD (gastroesophageal reflux disease) 02/05/2013  . Obesity 03/01/2008  . ADD 02/01/2008  . ALLERGIC RHINITIS 02/01/2008  . ASTHMA 02/01/2008   Past Medical History  Diagnosis Date  . Allergic rhinitis   . Asthma   . Depression   . Attention deficit disorder 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   . Elevated blood pressure reading without diagnosis of hypertension   . Obesity, unspecified    Past Surgical History  Procedure Laterality Date  . Ankle surgery    . Tonsillectomy and adenoidectomy     History  Substance Use Topics  . Smoking status: Former Smoker    Quit date: 12/28/2000  . Smokeless tobacco: Not on file  . Alcohol Use: No   Family History  Problem Relation Age of Onset  . Hypertension Father   . Other Father      heart problems  . Heart disease Father   . Hypothyroidism Sister   . Thyroid disease Sister     hypothyroid   Allergies  Allergen Reactions  . Escitalopram Oxalate     REACTION: sedation  . Prozac [Fluoxetine Hcl]     Wt gain    Current Outpatient Prescriptions on File Prior to Visit  Medication Sig Dispense Refill  . albuterol (PROVENTIL HFA;VENTOLIN HFA) 108 (90 BASE) MCG/ACT inhaler Inhale 2 puffs into the lungs every 4 (four) hours as needed. 3 Inhaler 3  . colchicine 0.6 MG tablet Take 1 tablet (0.6 mg total) by mouth daily. 7 tablet 0   No current facility-administered medications on file prior to visit.    Review of Systems    Review of Systems  Constitutional: Negative for fever, appetite change, fatigue and unexpected weight change.  Eyes: Negative for pain and visual disturbance.  Respiratory: Negative for cough and shortness of breath.   Cardiovascular: Negative for cp or palpitations    Gastrointestinal: Negative for nausea, diarrhea and constipation.  Genitourinary: Negative for urgency and frequency.  Skin: Negative for pallor or rash   MSK pos for gout in foot that is not bothering  him now  Neurological: Negative for weakness, light-headedness, numbness and headaches.  Hematological: Negative for adenopathy. Does not bruise/bleed easily.  Psychiatric/Behavioral: Negative for dysphoric mood.Pos for hx of depression that is controlled) The patient is not nervous/anxious.     j Objective:   Physical Exam  Constitutional: He appears well-developed and well-nourished. No distress.  obese and well appearing   HENT:  Head: Normocephalic and atraumatic.  Mouth/Throat: Oropharynx is clear and moist.  Eyes: Conjunctivae and EOM are normal. Pupils are equal, round, and reactive to light. No scleral icterus.  Neck: Normal range of motion. Neck supple. No JVD present. Carotid bruit is not present. No thyromegaly present.  Cardiovascular: Normal rate, regular rhythm,  normal heart sounds and intact distal pulses.  Exam reveals no gallop.   Pulmonary/Chest: Effort normal and breath sounds normal. No respiratory distress. He has no wheezes. He exhibits no tenderness.  Abdominal: Soft. Bowel sounds are normal. He exhibits no distension, no abdominal bruit and no mass. There is no tenderness.  Musculoskeletal: He exhibits no edema or tenderness.  Lymphadenopathy:    He has no cervical adenopathy.  Neurological: He is alert. He has normal reflexes. No cranial nerve deficit. He exhibits normal muscle tone. Coordination normal.  Skin: Skin is warm and dry. No rash noted. No erythema. No pallor.  Psychiatric: He has a normal mood and affect.          Assessment & Plan:   Problem List Items Addressed This Visit      Other   Depression    Stable on sertraline and no complaints  He has tried to stop it with no success  Refilled this  Enc more exercise as well       Relevant Medications   sertraline (ZOLOFT) tablet   Elevated blood pressure    Suspect early HTN -with fam hx  Recent wt gain may have exacerbated it  Disc lifestyle change in detail Will continue diet/exericse/wt loss effort  F/u in 4-6 wk Given handout on DASH eating plan  If not improved will disc tx (avoiding diuretics due to gout)      Relevant Orders   CBC with Differential/Platelet (Completed)   Comprehensive metabolic panel (Completed)   TSH (Completed)   Lipid panel (Completed)   Gout - Primary    Pt has flares occasionally  Just finished meloxicam (aware this can inc bp) Has colcrys for prn use  Disc low purine diet and need for better hydration  Uric acid level today with labs       Relevant Orders   Uric acid (Completed)   Obesity    Discussed how this problem influences overall health and the risks it imposes  Reviewed plan for weight loss with lower calorie diet (via better food choices and also portion control or program like weight watchers) and exercise  building up to or more than 30 minutes 5 days per week including some aerobic activity   He has started running on a treadmill and that is going well  Will re check weight at f/u       Other Visit Diagnoses    Morbid obesity

## 2015-03-14 ENCOUNTER — Ambulatory Visit: Payer: BLUE CROSS/BLUE SHIELD | Admitting: Podiatry

## 2015-03-14 LAB — CBC WITH DIFFERENTIAL/PLATELET
Basophils Absolute: 0 10*3/uL (ref 0.0–0.1)
Basophils Relative: 0.5 % (ref 0.0–3.0)
EOS ABS: 0.1 10*3/uL (ref 0.0–0.7)
Eosinophils Relative: 2.2 % (ref 0.0–5.0)
HCT: 44.8 % (ref 39.0–52.0)
Hemoglobin: 15.5 g/dL (ref 13.0–17.0)
Lymphocytes Relative: 41 % (ref 12.0–46.0)
Lymphs Abs: 2.2 10*3/uL (ref 0.7–4.0)
MCHC: 34.5 g/dL (ref 30.0–36.0)
MCV: 85.3 fl (ref 78.0–100.0)
MONOS PCT: 6.8 % (ref 3.0–12.0)
Monocytes Absolute: 0.4 10*3/uL (ref 0.1–1.0)
NEUTROS ABS: 2.6 10*3/uL (ref 1.4–7.7)
Neutrophils Relative %: 49.5 % (ref 43.0–77.0)
Platelets: 203 10*3/uL (ref 150.0–400.0)
RBC: 5.26 Mil/uL (ref 4.22–5.81)
RDW: 13.3 % (ref 11.5–15.5)
WBC: 5.3 10*3/uL (ref 4.0–10.5)

## 2015-03-14 LAB — COMPREHENSIVE METABOLIC PANEL
ALK PHOS: 72 U/L (ref 39–117)
ALT: 41 U/L (ref 0–53)
AST: 26 U/L (ref 0–37)
Albumin: 4.8 g/dL (ref 3.5–5.2)
BILIRUBIN TOTAL: 0.9 mg/dL (ref 0.2–1.2)
BUN: 20 mg/dL (ref 6–23)
CALCIUM: 9.2 mg/dL (ref 8.4–10.5)
CO2: 30 meq/L (ref 19–32)
CREATININE: 1.15 mg/dL (ref 0.40–1.50)
Chloride: 104 mEq/L (ref 96–112)
GFR: 74.6 mL/min (ref >60.00)
Glucose, Bld: 93 mg/dL (ref 70–99)
Potassium: 4.5 mEq/L (ref 3.5–5.1)
Sodium: 140 mEq/L (ref 135–145)
Total Protein: 7.4 g/dL (ref 6.0–8.3)

## 2015-03-14 LAB — URIC ACID: URIC ACID, SERUM: 7.6 mg/dL (ref 4.0–7.8)

## 2015-03-14 LAB — LIPID PANEL
Cholesterol: 200 mg/dL (ref 0–200)
HDL: 34.4 mg/dL — ABNORMAL LOW (ref >39.00)
LDL Cholesterol: 131 mg/dL — ABNORMAL HIGH (ref 0–99)
NONHDL: 165.6
Total CHOL/HDL Ratio: 6
Triglycerides: 171 mg/dL — ABNORMAL HIGH (ref 0.0–149.0)
VLDL: 34.2 mg/dL (ref 0.0–40.0)

## 2015-03-14 LAB — TSH: TSH: 1.16 u[IU]/mL (ref 0.35–4.50)

## 2015-03-14 NOTE — Assessment & Plan Note (Signed)
Stable on sertraline and no complaints  He has tried to stop it with no success  Refilled this  Enc more exercise as well

## 2015-03-14 NOTE — Assessment & Plan Note (Signed)
Discussed how this problem influences overall health and the risks it imposes  Reviewed plan for weight loss with lower calorie diet (via better food choices and also portion control or program like weight watchers) and exercise building up to or more than 30 minutes 5 days per week including some aerobic activity   He has started running on a treadmill and that is going well  Will re check weight at f/u

## 2015-03-14 NOTE — Assessment & Plan Note (Signed)
Pt has flares occasionally  Just finished meloxicam (aware this can inc bp) Has colcrys for prn use  Disc low purine diet and need for better hydration  Uric acid level today with labs

## 2015-03-14 NOTE — Assessment & Plan Note (Signed)
Suspect early HTN -with fam hx  Recent wt gain may have exacerbated it  Disc lifestyle change in detail Will continue diet/exericse/wt loss effort  F/u in 4-6 wk Given handout on DASH eating plan  If not improved will disc tx (avoiding diuretics due to gout)

## 2015-04-10 ENCOUNTER — Encounter: Payer: Self-pay | Admitting: Family Medicine

## 2015-04-10 ENCOUNTER — Ambulatory Visit (INDEPENDENT_AMBULATORY_CARE_PROVIDER_SITE_OTHER): Payer: BLUE CROSS/BLUE SHIELD | Admitting: Family Medicine

## 2015-04-10 VITALS — BP 132/80 | HR 60 | Temp 98.4°F | Wt 301.5 lb

## 2015-04-10 DIAGNOSIS — R030 Elevated blood-pressure reading, without diagnosis of hypertension: Secondary | ICD-10-CM

## 2015-04-10 DIAGNOSIS — IMO0001 Reserved for inherently not codable concepts without codable children: Secondary | ICD-10-CM

## 2015-04-10 NOTE — Patient Instructions (Addendum)
Great job with lifestyle change so far ! Keep up the good work  Blood pressure is better -keep watching it   To increase HDL cholesterol -keep exercising and eat fish (not shell fish)

## 2015-04-10 NOTE — Progress Notes (Signed)
Pre visit review using our clinic review tool, if applicable. No additional management support is needed unless otherwise documented below in the visit note. 

## 2015-04-10 NOTE — Assessment & Plan Note (Signed)
Improved with lifestyle change and wt loss  Commended! He plans to keep it up and keep check of bp  Will watch cholesterol also-disc ways to inc HDL

## 2015-04-10 NOTE — Progress Notes (Signed)
Subjective:    Patient ID: Samuel Murray, male    DOB: 03/01/1976, [age] y.o.   MRN: [id number]  HPI Here for f/u of elevated blood pressure   Wt is down 8 lb with bmi of38 Down 16 lb by his scale - ! Very good   Has started lifestyle change  Eating smaller portions and much less for dinner  Using treadmill and logging 25 miles per week - running   BP Readings from Last 3 Encounters:  04/10/15 134/84  03/13/15 142/98  03/05/15 127/64    Checked bp this am 132/88 -this am   Is feeling pretty good   Had labs done last wk at [facility] -for ins co (can get rebate)  Glucose was 105 Uric acid 8.9  Total cholesterol 191 HDL 30 LDL 126  Tchol/HDL ratio 6.4 Trig 175   Lab Results  Component Value Date   CHOL 200 03/13/2015   HDL 34.40* 03/13/2015   LDLCALC 131* 03/13/2015   LDLDIRECT 132.0 02/11/2010   TRIG 171.0* 03/13/2015   CHOLHDL 6 03/13/2015    Cannot take fish oil-gives him gout (tried omega 3)  Is able to eat fish   Patient Active Problem List   Diagnosis Date Noted  . Depression 03/13/2015  . Elevated blood pressure 03/13/2015  . Gout 03/06/2015  . GERD (gastroesophageal reflux disease) 02/05/2013  . Obesity 03/01/2008  . ADD 02/01/2008  . ALLERGIC RHINITIS 02/01/2008  . ASTHMA 02/01/2008   Past Medical History  Diagnosis Date  . Allergic rhinitis   . Asthma   . Depression   . Attention deficit disorder 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   . Elevated blood pressure reading without diagnosis of hypertension   . Obesity, unspecified    Past Surgical History  Procedure Laterality Date  . Ankle surgery    . Tonsillectomy and adenoidectomy     History  Substance Use Topics  . Smoking status: Former Smoker    Quit date: 12/28/2000  . Smokeless tobacco: Not on file  . Alcohol Use: No   Family History  Problem Relation Age of Onset  . Hypertension Father   . Other Father     heart problems  . Heart disease Father   . Hypothyroidism Sister     . Thyroid disease Sister     hypothyroid   Allergies  Allergen Reactions  . Escitalopram Oxalate     REACTION: sedation  . Prozac [Fluoxetine Hcl]     Wt gain    Current Outpatient Prescriptions on File Prior to Visit  Medication Sig Dispense Refill  . albuterol (PROVENTIL HFA;VENTOLIN HFA) 108 (90 BASE) MCG/ACT inhaler Inhale 2 puffs into the lungs every 4 (four) hours as needed. 3 Inhaler 3  . fluticasone (FLONASE) 50 MCG/ACT nasal spray Place 2 sprays into both nostrils daily. 48 g 3  . lansoprazole (PREVACID) 30 MG capsule Take 1 capsule (30 mg total) by mouth daily. 90 capsule 3  . sertraline (ZOLOFT) 50 MG tablet TAKE ONE TABLET ONCE DAILY 90 tablet 3  . colchicine 0.6 MG tablet Take 1 tablet (0.6 mg total) by mouth daily. (Patient not taking: Reported on 04/10/2015) 7 tablet 0   No current facility-administered medications on file prior to visit.      Review of Systems    Review of Systems  Constitutional: Negative for fever, appetite change, fatigue and unexpected weight change.  Eyes: Negative for pain and visual disturbance.  Respiratory: Negative for cough and shortness of breath.  Cardiovascular: Negative for cp or palpitations    Gastrointestinal: Negative for nausea, diarrhea and constipation.  Genitourinary: Negative for urgency and frequency.  Skin: Negative for pallor or rash   Neurological: Negative for weakness, light-headedness, numbness and headaches.  Hematological: Negative for adenopathy. Does not bruise/bleed easily.  Psychiatric/Behavioral: Negative for dysphoric mood. The patient is not nervous/anxious.      Objective:   Physical Exam  Constitutional: He appears well-developed and well-nourished. No distress.  obese and well appearing   HENT:  Head: Normocephalic and atraumatic.  Mouth/Throat: Oropharynx is clear and moist.  Eyes: Conjunctivae and EOM are normal. Pupils are equal, round, and reactive to light.  Neck: Normal range of motion.  Neck supple. No JVD present. Carotid bruit is not present. No thyromegaly present.  Cardiovascular: Normal rate, regular rhythm, normal heart sounds and intact distal pulses.  Exam reveals no gallop.   Pulmonary/Chest: Effort normal and breath sounds normal. No respiratory distress. He has no wheezes. He has no rales.  No crackles  Abdominal: Soft. Bowel sounds are normal. He exhibits no distension, no abdominal bruit and no mass. There is no tenderness.  Musculoskeletal: He exhibits no edema.  Lymphadenopathy:    He has no cervical adenopathy.  Neurological: He is alert. He has normal reflexes.  Skin: Skin is warm and dry. No rash noted.  Psychiatric: He has a normal mood and affect.          Assessment & Plan:   Problem List Items Addressed This Visit      Other   Elevated blood pressure - Primary    Improved with lifestyle change and wt loss  Commended! He plans to keep it up and keep check of bp  Will watch cholesterol also-disc ways to inc HDL

## 2015-04-24 ENCOUNTER — Encounter: Payer: Self-pay | Admitting: Family Medicine

## 2015-08-12 ENCOUNTER — Ambulatory Visit (INDEPENDENT_AMBULATORY_CARE_PROVIDER_SITE_OTHER): Payer: BLUE CROSS/BLUE SHIELD | Admitting: Internal Medicine

## 2015-08-12 ENCOUNTER — Encounter: Payer: Self-pay | Admitting: Internal Medicine

## 2015-08-12 VITALS — BP 134/80 | HR 104 | Temp 98.7°F | Wt 302.8 lb

## 2015-08-12 DIAGNOSIS — T63441A Toxic effect of venom of bees, accidental (unintentional), initial encounter: Secondary | ICD-10-CM | POA: Diagnosis not present

## 2015-08-12 MED ORDER — METHYLPREDNISOLONE ACETATE 80 MG/ML IJ SUSP
80.0000 mg | Freq: Once | INTRAMUSCULAR | Status: AC
  Administered 2015-08-12: 80 mg via INTRAMUSCULAR

## 2015-08-12 NOTE — Addendum Note (Signed)
Addended by: Roena Malady on: 08/12/2015 09:49 AM   Modules accepted: Orders

## 2015-08-12 NOTE — Progress Notes (Signed)
Pre visit review using our clinic review tool, if applicable. No additional management support is needed unless otherwise documented below in the visit note. 

## 2015-08-12 NOTE — Progress Notes (Signed)
Subjective:    Patient ID: Samuel Murray, male    DOB: [birth year], 39 y.o.   MRN: [id number]  HPI  Pt presents to the clinic today with c/o a bee sting on the left side of his forhead. This occurred yesterday. He has noticed some swelling in the area but no pain. The area is very itchy. He has not noticed any drainage. He did take Benadryl last night and this morning without much relief. He denies fever, chills or swelling of the lips or throat. He is not allergic to bees that he is aware of.  Review of Systems      Past Medical History  Diagnosis Date  . Allergic rhinitis   . Asthma   . Depression   . Attention deficit disorder 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   . Elevated blood pressure reading without diagnosis of hypertension   . Obesity, unspecified     Current Outpatient Prescriptions  Medication Sig Dispense Refill  . albuterol (PROVENTIL HFA;VENTOLIN HFA) 108 (90 BASE) MCG/ACT inhaler Inhale 2 puffs into the lungs every 4 (four) hours as needed. 3 Inhaler 3  . colchicine 0.6 MG tablet Take 1 tablet (0.6 mg total) by mouth daily. 7 tablet 0  . fluticasone (FLONASE) 50 MCG/ACT nasal spray Place 2 sprays into both nostrils daily. 48 g 3  . lansoprazole (PREVACID) 30 MG capsule Take 1 capsule (30 mg total) by mouth daily. 90 capsule 3  . sertraline (ZOLOFT) 50 MG tablet TAKE ONE TABLET ONCE DAILY 90 tablet 3   No current facility-administered medications for this visit.    Allergies  Allergen Reactions  . Escitalopram Oxalate     REACTION: sedation  . Prozac [Fluoxetine Hcl]     Wt gain     Family History  Problem Relation Age of Onset  . Hypertension Father   . Other Father     heart problems  . Heart disease Father   . Hypothyroidism Sister   . Thyroid disease Sister     hypothyroid    Social History   Social History  . Marital Status: Married    Spouse Name: N/A  . Number of Children: N/A  . Years of Education: N/A   Occupational History  .   Other    [employer]   Social History Main Topics  . Smoking status: Former Smoker    Quit date: 12/28/2000  . Smokeless tobacco: Not on file  . Alcohol Use: No  . Drug Use: No  . Sexual Activity: Not on file   Other Topics Concern  . Not on file   Social History Narrative   Married     Constitutional: Denies fever, malaise, fatigue, headache or abrupt weight changes.  Respiratory: Denies difficulty breathing, shortness of breath, cough or sputum production.   Cardiovascular: Denies chest pain, chest tightness, palpitations or swelling in the hands or feet.  Skin: Denies rashes or ulcercations.   No other specific complaints in a complete review of systems (except as listed in HPI above).  Objective:   Physical Exam  BP 134/80 mmHg  Pulse 104  Temp(Src) 98.7 F (37.1 C) (Oral)  Wt 302 lb 12.8 oz (137.349 kg)  SpO2 98% Wt Readings from Last 3 Encounters:  08/12/15 302 lb 12.8 oz (137.349 kg)  04/10/15 301 lb 8 oz (136.76 kg)  03/13/15 309 lb 6.4 oz (140.343 kg)    General: Appears his stated age, well developed, well nourished in NAD. Skin: Small puncture to left forehead  with surround swelling. No redness, warmth or drainage noted. Cardiovascular: Normal rate and rhythm. S1,S2 noted.  No murmur, rubs or gallops noted.  Pulmonary/Chest: Normal effort and positive vesicular breath sounds. No respiratory distress. No wheezes, rales or ronchi noted.  Neurological: Alert and oriented.   BMET    Component Value Date/Time   NA 140 03/13/2015 1752   K 4.5 03/13/2015 1752   CL 104 03/13/2015 1752   CO2 30 03/13/2015 1752   GLUCOSE 93 03/13/2015 1752   BUN 20 03/13/2015 1752   CREATININE 1.15 03/13/2015 1752   CALCIUM 9.2 03/13/2015 1752   GFRNONAA 66.53 02/11/2010 1547   GFRAA 99 03/01/2008 1046    Lipid Panel     Component Value Date/Time   CHOL 200 03/13/2015 1752   TRIG 171.0* 03/13/2015 1752   HDL 34.40* 03/13/2015 1752   CHOLHDL 6 03/13/2015 1752   VLDL  34.2 03/13/2015 1752   LDLCALC 131* 03/13/2015 1752    CBC    Component Value Date/Time   WBC 5.3 03/13/2015 1752   RBC 5.26 03/13/2015 1752   HGB 15.5 03/13/2015 1752   HCT 44.8 03/13/2015 1752   PLT 203.0 03/13/2015 1752   MCV 85.3 03/13/2015 1752   MCHC 34.5 03/13/2015 1752   RDW 13.3 03/13/2015 1752   LYMPHSABS 2.2 03/13/2015 1752   MONOABS 0.4 03/13/2015 1752   EOSABS 0.1 03/13/2015 1752   BASOSABS 0.0 03/13/2015 1752    Hgb A1C No results found for: HGBA1C      Assessment & Plan:   Bee sting to forehead with local reaction:  80 mg Depo IM today Cool compresses TID  Ok to continue Benadryl as needed Watch for increased redness, pain, warmth to touch or drainage  RTC as needed or if symptoms persist or worsen

## 2015-08-12 NOTE — Patient Instructions (Signed)

## 2015-11-06 ENCOUNTER — Encounter: Payer: BLUE CROSS/BLUE SHIELD | Admitting: Family Medicine

## 2015-12-06 ENCOUNTER — Ambulatory Visit (INDEPENDENT_AMBULATORY_CARE_PROVIDER_SITE_OTHER): Payer: BLUE CROSS/BLUE SHIELD | Admitting: Family Medicine

## 2015-12-06 ENCOUNTER — Encounter: Payer: Self-pay | Admitting: Family Medicine

## 2015-12-06 VITALS — BP 134/90 | HR 65 | Temp 98.5°F | Wt 307.5 lb

## 2015-12-06 DIAGNOSIS — Z0000 Encounter for general adult medical examination without abnormal findings: Secondary | ICD-10-CM | POA: Diagnosis not present

## 2015-12-06 DIAGNOSIS — I10 Essential (primary) hypertension: Secondary | ICD-10-CM | POA: Diagnosis not present

## 2015-12-06 DIAGNOSIS — E669 Obesity, unspecified: Secondary | ICD-10-CM | POA: Diagnosis not present

## 2015-12-06 DIAGNOSIS — Z23 Encounter for immunization: Secondary | ICD-10-CM | POA: Diagnosis not present

## 2015-12-06 MED ORDER — HYDROCHLOROTHIAZIDE 25 MG PO TABS: 25.0000 mg | ORAL_TABLET | Freq: Every day | ORAL | Status: DC

## 2015-12-06 MED ORDER — FLUTICASONE PROPIONATE 50 MCG/ACT NA SUSP: 2.0000 | Freq: Every day | NASAL | Status: DC

## 2015-12-06 MED ORDER — LANSOPRAZOLE 30 MG PO CPDR: DELAYED_RELEASE_CAPSULE | ORAL | Status: DC

## 2015-12-06 MED ORDER — SERTRALINE HCL 50 MG PO TABS: ORAL_TABLET | ORAL | Status: DC

## 2015-12-06 MED ORDER — ALBUTEROL SULFATE HFA 108 (90 BASE) MCG/ACT IN AERS: 2.0000 | INHALATION_SPRAY | RESPIRATORY_TRACT | Status: DC | PRN

## 2015-12-06 NOTE — Progress Notes (Signed)
Subjective:    Patient ID: Samuel Murray, male    DOB: 05/15/1976, 39 y.o.   MRN: [id number]  HPI Here for health maintenance exam and to review chronic medical problems    Has been doing well  Same as always   Taking care of his parents and in laws     Wt is up 5 lb with bmi of 39  Trying to take care of himself  Some running - but he has a torn ACL in his R knee that needs repair - bothers him  Uses a treadmill with a flex deck so he can run- really likes it  Uses it every day for walking as well - walks 4 times per day/ 10,000 steps or more per day  Breaks up the time when he can   Eating is fair - hard around the holidays  Pretty good before that  Working on portion sizes mostly  He has used tracking programs for intake /calories  Downfall is dinner -needs to spend more time making salads    HIV screen -not interested /not high risk   Flu shot -had today     Td 3/11  Hx of borderline bp  BP Readings from Last 3 Encounters:  12/06/15 134/90  08/12/15 134/80  04/10/15 132/80     Labs in the spring Office Visit on 03/13/2015  Component Date Value Ref Range Status  . WBC 03/13/2015 5.3  4.0 - 10.5 K/uL Final  . RBC 03/13/2015 5.26  4.22 - 5.81 Mil/uL Final  . Hemoglobin 03/13/2015 15.5  13.0 - 17.0 g/dL Final  . HCT 41/32/440103/16/2016 44.8  39.0 - 52.0 % Final  . MCV 03/13/2015 85.3  78.0 - 100.0 fl Final  . MCHC 03/13/2015 34.5  30.0 - 36.0 g/dL Final  . RDW 02/72/536603/16/2016 13.3  11.5 - 15.5 % Final  . Platelets 03/13/2015 203.0  150.0 - 400.0 K/uL Final  . Neutrophils Relative % 03/13/2015 49.5  43.0 - 77.0 % Final  . Lymphocytes Relative 03/13/2015 41.0  12.0 - 46.0 % Final  . Monocytes Relative 03/13/2015 6.8  3.0 - 12.0 % Final  . Eosinophils Relative 03/13/2015 2.2  0.0 - 5.0 % Final  . Basophils Relative 03/13/2015 0.5  0.0 - 3.0 % Final  . Neutro Abs 03/13/2015 2.6  1.4 - 7.7 K/uL Final  . Lymphs Abs 03/13/2015 2.2  0.7 - 4.0 K/uL Final  . Monocytes Absolute  03/13/2015 0.4  0.1 - 1.0 K/uL Final  . Eosinophils Absolute 03/13/2015 0.1  0.0 - 0.7 K/uL Final  . Basophils Absolute 03/13/2015 0.0  0.0 - 0.1 K/uL Final  . Sodium 03/13/2015 140  135 - 145 mEq/L Final  . Potassium 03/13/2015 4.5  3.5 - 5.1 mEq/L Final  . Chloride 03/13/2015 104  96 - 112 mEq/L Final  . CO2 03/13/2015 30  19 - 32 mEq/L Final  . Glucose, Bld 03/13/2015 93  70 - 99 mg/dL Final  . BUN 44/03/474203/16/2016 20  6 - 23 mg/dL Final  . Creatinine, Ser 03/13/2015 1.15  0.40 - 1.50 mg/dL Final  . Total Bilirubin 03/13/2015 0.9  0.2 - 1.2 mg/dL Final  . Alkaline Phosphatase 03/13/2015 72  39 - 117 U/L Final  . AST 03/13/2015 26  0 - 37 U/L Final  . ALT 03/13/2015 41  0 - 53 U/L Final  . Total Protein 03/13/2015 7.4  6.0 - 8.3 g/dL Final  . Albumin 59/56/387503/16/2016 4.8  3.5 - 5.2 g/dL  Final  . Calcium 03/13/2015 9.2  8.4 - 10.5 mg/dL Final  . GFR 78/29/5621 74.60  >60.00 mL/min Final  . TSH 03/13/2015 1.16  0.35 - 4.50 uIU/mL Final  . Cholesterol 03/13/2015 200  0 - 200 mg/dL Final   ATP III Classification       Desirable:  < 200 mg/dL               Borderline High:  200 - 239 mg/dL          High:  > = 308 mg/dL  . Triglycerides 03/13/2015 171.0* 0.0 - 149.0 mg/dL Final   Normal:  <657 mg/dLBorderline High:  150 - 199 mg/dL  . HDL 03/13/2015 34.40* >39.00 mg/dL Final  . VLDL 84/69/6295 34.2  0.0 - 40.0 mg/dL Final  . LDL Cholesterol 03/13/2015 131* 0 - 99 mg/dL Final  . Total CHOL/HDL Ratio 03/13/2015 6   Final                  Men          Women1/2 Average Risk     3.4          3.3Average Risk          5.0          4.42X Average Risk          9.6          7.13X Average Risk          15.0          11.0                      . NonHDL 03/13/2015 165.60   Final   NOTE:  Non-HDL goal should be 30 mg/dL higher than patient's LDL goal (i.e. LDL goal of < 70 mg/dL, would have non-HDL goal of < 100 mg/dL)  . Uric Acid, Serum 03/13/2015 7.6  4.0 - 7.8 mg/dL Final      No gout issues   Lab Results    Component Value Date   CHOL 200 03/13/2015   CHOL 187 02/11/2010   Lab Results  Component Value Date   HDL 34.40* 03/13/2015   HDL 35.60* 02/11/2010   Lab Results  Component Value Date   LDLCALC 131* 03/13/2015   Lab Results  Component Value Date   TRIG 171.0* 03/13/2015   TRIG 226.0* 02/11/2010   Lab Results  Component Value Date   CHOLHDL 6 03/13/2015   CHOLHDL 5 02/11/2010   Lab Results  Component Value Date   LDLDIRECT 132.0 02/11/2010   HDL is low despite good exercise   He tried fish oil and got gout- has not re tried it  Does not eat a lot of fish   LDL is 131 - goal is under 130/ optimally under 100   Eats a lot of green veg - grows kale and lots of greens    Patient Active Problem List   Diagnosis Date Noted  . Routine general medical examination at a health care facility 12/06/2015  . Depression 03/13/2015  . Essential hypertension 03/13/2015  . Gout 03/06/2015  . GERD (gastroesophageal reflux disease) 02/05/2013  . Obesity 03/01/2008  . ADD 02/01/2008  . ALLERGIC RHINITIS 02/01/2008  . ASTHMA 02/01/2008   Past Medical History  Diagnosis Date  . Allergic rhinitis   . Asthma   . Depression   . Attention deficit disorder 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   . Elevated blood  pressure reading without diagnosis of hypertension   . Obesity, unspecified    Past Surgical History  Procedure Laterality Date  . Ankle surgery    . Tonsillectomy and adenoidectomy     Social History  Substance Use Topics  . Smoking status: Former Smoker    Quit date: 12/28/2000  . Smokeless tobacco: None  . Alcohol Use: No   Family History  Problem Relation Age of Onset  . Hypertension Father   . Other Father     heart problems  . Heart disease Father   . Hypothyroidism Sister   . Thyroid disease Sister     hypothyroid   Allergies  Allergen Reactions  . Escitalopram Oxalate     REACTION: sedation  . Prozac [Fluoxetine Hcl]     Wt gain    No current  outpatient prescriptions on file prior to visit.   No current facility-administered medications on file prior to visit.    Review of Systems Review of Systems  Constitutional: Negative for fever, appetite change, fatigue and unexpected weight change.  Eyes: Negative for pain and visual disturbance.  Respiratory: Negative for cough and shortness of breath.   Cardiovascular: Negative for cp or palpitations    Gastrointestinal: Negative for nausea, diarrhea and constipation.  Genitourinary: Negative for urgency and frequency.  Skin: Negative for pallor or rash   MSK pos for intermittent knee pain  Neurological: Negative for weakness, light-headedness, numbness and headaches.  Hematological: Negative for adenopathy. Does not bruise/bleed easily.  Psychiatric/Behavioral: Negative for dysphoric mood. The patient is not nervous/anxious.         Objective:   Physical Exam  Constitutional: He appears well-developed and well-nourished. No distress.  obese and well appearing   HENT:  Head: Normocephalic and atraumatic.  Right Ear: External ear normal.  Left Ear: External ear normal.  Nose: Nose normal.  Mouth/Throat: Oropharynx is clear and moist.  Nares are boggy  Eyes: Conjunctivae and EOM are normal. Pupils are equal, round, and reactive to light. Right eye exhibits no discharge. Left eye exhibits no discharge. No scleral icterus.  Neck: Normal range of motion. Neck supple. No JVD present. Carotid bruit is not present. No thyromegaly present.  Cardiovascular: Normal rate, regular rhythm, normal heart sounds and intact distal pulses.  Exam reveals no gallop.   Pulmonary/Chest: Effort normal and breath sounds normal. No respiratory distress. He has no wheezes. He exhibits no tenderness.  Abdominal: Soft. Bowel sounds are normal. He exhibits no distension, no abdominal bruit and no mass. There is no tenderness.  Musculoskeletal: He exhibits no edema or tenderness.  Lymphadenopathy:    He  has no cervical adenopathy.  Neurological: He is alert. He has normal reflexes. No cranial nerve deficit. He exhibits normal muscle tone. Coordination normal.  Skin: Skin is warm and dry. No rash noted. No erythema. No pallor.  Psychiatric: He has a normal mood and affect.          Assessment & Plan:   Problem List Items Addressed This Visit      Cardiovascular and Mediastinum   Essential hypertension    bp in fair control at this time  BP Readings from Last 1 Encounters:  12/06/15 134/90   Disc tx for this -will begin hctz 25 mg daily  Disc lifstyle change with low sodium diet and exercise  Given handout on DASH diet  Labs reviewed F/u planned  Enc wt loss        Relevant Medications   hydrochlorothiazide (HYDRODIURIL)  25 MG tablet     Other   Obesity    Discussed how this problem influences overall health and the risks it imposes  Reviewed plan for weight loss with lower calorie diet (via better food choices and also portion control or program like weight watchers) and exercise building up to or more than 30 minutes 5 days per week including some aerobic activity   Enc to get back to low impact exercise  Also consider using tracking program for calories       Routine general medical examination at a health care facility    Reviewed health habits including diet and exercise and skin cancer prevention Reviewed appropriate screening tests for age  Also reviewed health mt list, fam hx and immunization status , as well as social and family history   See HPI Labs reviewed  Enc wt loss Will begin hctz for HTN and follow up          Other Visit Diagnoses    Need for influenza vaccination    -  Primary    Relevant Orders    Flu Vaccine QUAD 36+ mos PF IM (Fluarix & Fluzone Quad PF) (Completed)

## 2015-12-06 NOTE — Patient Instructions (Signed)
Take care of yourself  Keep working on weight loss -think about using a tracking program for calories (like myfitnesspal)  Also keep exercising  Eating fish (with fins) helps HDL (good) cholesterol  To reduce bad cholesterol  Avoid red meat/ fried foods/ egg yolks/ fatty breakfast meats/ butter, cheese and high fat dairy/ and shellfish   Blood pressure is up - start HCTZ 25 mg each am - update me if any side effects or problem  Pay attention to sodium -take a look at the [facility]

## 2015-12-06 NOTE — Progress Notes (Signed)
Pre visit review using our clinic review tool, if applicable. No additional management support is needed unless otherwise documented below in the visit note. 

## 2015-12-08 NOTE — Assessment & Plan Note (Signed)
Discussed how this problem influences overall health and the risks it imposes  Reviewed plan for weight loss with lower calorie diet (via better food choices and also portion control or program like weight watchers) and exercise building up to or more than 30 minutes 5 days per week including some aerobic activity   Enc to get back to low impact exercise  Also consider using tracking program for calories

## 2015-12-08 NOTE — Assessment & Plan Note (Signed)
bp in fair control at this time  BP Readings from Last 1 Encounters:  12/06/15 134/90   Disc tx for this -will begin hctz 25 mg daily  Disc lifstyle change with low sodium diet and exercise  Given handout on DASH diet  Labs reviewed F/u planned  Enc wt loss

## 2015-12-08 NOTE — Assessment & Plan Note (Signed)
Reviewed health habits including diet and exercise and skin cancer prevention Reviewed appropriate screening tests for age  Also reviewed health mt list, fam hx and immunization status , as well as social and family history   See HPI Labs reviewed  Enc wt loss Will begin hctz for HTN and follow up

## 2016-04-22 ENCOUNTER — Encounter: Payer: Self-pay | Admitting: Family Medicine

## 2016-04-22 ENCOUNTER — Ambulatory Visit (INDEPENDENT_AMBULATORY_CARE_PROVIDER_SITE_OTHER): Payer: BLUE CROSS/BLUE SHIELD | Admitting: Family Medicine

## 2016-04-22 VITALS — BP 128/84 | HR 68 | Temp 98.5°F | Ht 71.5 in | Wt 304.5 lb

## 2016-04-22 DIAGNOSIS — E786 Lipoprotein deficiency: Secondary | ICD-10-CM | POA: Diagnosis not present

## 2016-04-22 DIAGNOSIS — R739 Hyperglycemia, unspecified: Secondary | ICD-10-CM

## 2016-04-22 DIAGNOSIS — I10 Essential (primary) hypertension: Secondary | ICD-10-CM

## 2016-04-22 DIAGNOSIS — M10079 Idiopathic gout, unspecified ankle and foot: Secondary | ICD-10-CM

## 2016-04-22 DIAGNOSIS — M109 Gout, unspecified: Secondary | ICD-10-CM

## 2016-04-22 NOTE — Progress Notes (Signed)
Subjective:    Patient ID: Samuel Murray, male    DOB: 07/19/1976, 40 y.o.   MRN: [id number]  HPI Here for f/u of chronic medical problems  Has been busy Planting a garden   Wt is down 3 lb  Morbid obese category with bmi of 41 Trying to take care of himself   Struggles to loose wt -watching diet carefully  Needs to work out with [employer]   Tried to start running - then needed cortisone shot (OA of knee) May get synervisc  Also may have a torn ACL in the knee  Can use an elliptical machine (lower impact)  bp is improved today - had started HCTZ at last visit No side effects or problems  No cp or palpitations or headaches or edema  No side effects to medicines  BP Readings from Last 3 Encounters:  04/22/16 128/84  12/06/15 134/90  08/12/15 134/80    Watching sodium   Had [facility] labs  4/17 Nl chem Uric acid 10.1 Glucose was 119 however he had just had a cortisone shot  Nl chem except ALT of 56  ? If fatty liver  Takes a fair amt of acetaminophen  No etoh  Cholesterol total 189 Trig 141 HDL low at 34  And LDL 127   Patient Active Problem List   Diagnosis Date Noted  . Hyperglycemia 04/22/2016  . Low HDL (under 40) 04/22/2016  . Routine general medical examination at a health care facility 12/06/2015  . Depression 03/13/2015  . Essential hypertension 03/13/2015  . Gout 03/06/2015  . GERD (gastroesophageal reflux disease) 02/05/2013  . Morbid obesity (HCC) 03/01/2008  . ADD 02/01/2008  . ALLERGIC RHINITIS 02/01/2008  . ASTHMA 02/01/2008   Past Medical History  Diagnosis Date  . Allergic rhinitis   . Asthma   . Depression   . Attention deficit disorder 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   . Elevated blood pressure reading without diagnosis of hypertension   . Obesity, unspecified    Past Surgical History  Procedure Laterality Date  . Ankle surgery    . Tonsillectomy and adenoidectomy     Social History  Substance Use Topics  . Smoking  status: Former Smoker    Quit date: 12/28/2000  . Smokeless tobacco: None  . Alcohol Use: No   Family History  Problem Relation Age of Onset  . Hypertension Father   . Other Father     heart problems  . Heart disease Father   . Hypothyroidism Sister   . Thyroid disease Sister     hypothyroid   Allergies  Allergen Reactions  . Escitalopram Oxalate     REACTION: sedation  . Prozac [Fluoxetine Hcl]     Wt gain    Current Outpatient Prescriptions on File Prior to Visit  Medication Sig Dispense Refill  . albuterol (PROVENTIL HFA;VENTOLIN HFA) 108 (90 BASE) MCG/ACT inhaler Inhale 2 puffs into the lungs every 4 (four) hours as needed. 3 Inhaler 11  . fluticasone (FLONASE) 50 MCG/ACT nasal spray Place 2 sprays into both nostrils daily. 48 g 3  . hydrochlorothiazide (HYDRODIURIL) 25 MG tablet Take 1 tablet (25 mg total) by mouth daily. 30 tablet 11  . lansoprazole (PREVACID) 30 MG capsule Take 1 capsule (30 mg total) by mouth daily. 90 capsule 3  . sertraline (ZOLOFT) 50 MG tablet TAKE ONE TABLET ONCE DAILY 90 tablet 3   No current facility-administered medications on file prior to visit.     Review of Systems Review  of Systems  Constitutional: Negative for fever, appetite change, fatigue and unexpected weight change.  Eyes: Negative for pain and visual disturbance.  Respiratory: Negative for cough and shortness of breath.   Cardiovascular: Negative for cp or palpitations    Gastrointestinal: Negative for nausea, diarrhea and constipation.  Genitourinary: Negative for urgency and frequency.  Skin: Negative for pallor or rash   MSK pos for knee pain and neg for recent gout attacks Neurological: Negative for weakness, light-headedness, numbness and headaches.  Hematological: Negative for adenopathy. Does not bruise/bleed easily.  Psychiatric/Behavioral: Negative for dysphoric mood. The patient is not nervous/anxious.         Objective:   Physical Exam  Constitutional: He  appears well-developed and well-nourished. No distress.  Morbidly obese and well appearing  Also muscular build  HENT:  Head: Normocephalic and atraumatic.  Mouth/Throat: Oropharynx is clear and moist.  Eyes: Conjunctivae and EOM are normal. Pupils are equal, round, and reactive to light.  Neck: Normal range of motion. Neck supple. No JVD present. Carotid bruit is not present. No thyromegaly present.  Cardiovascular: Normal rate, regular rhythm, normal heart sounds and intact distal pulses.  Exam reveals no gallop.   Pulmonary/Chest: Effort normal and breath sounds normal. No respiratory distress. He has no wheezes. He has no rales.  No crackles  Abdominal: Soft. Bowel sounds are normal. He exhibits no distension, no abdominal bruit and no mass. There is no tenderness.  Musculoskeletal: He exhibits no edema.  Limited rom of knees   Lymphadenopathy:    He has no cervical adenopathy.  Neurological: He is alert. He has normal reflexes. No cranial nerve deficit. He exhibits normal muscle tone. Coordination normal.  Skin: Skin is warm and dry. No rash noted. No pallor.  Psychiatric: He has a normal mood and affect.  Pleasant and talkative           Assessment & Plan:   Problem List Items Addressed This Visit      Cardiovascular and Mediastinum   Essential hypertension - Primary    bp in fair control at this time  BP Readings from Last 1 Encounters:  04/22/16 128/84   No changes needed Disc lifstyle change with low sodium diet and exercise  Labs reviewed from [facility] Wt loss enc        Other   Gout    Uric acid was high-but no attacks lately Disc low purine diet and handout given  Enc good water intake       Hyperglycemia    This was just after a cortisone shot-suspect that was the cause  Will continue to follow Low glycemic diet and wt loss end to prevent DM2      Low HDL (under 40)    Disc low sat fat diet for LDL  Exercise and fish/ fish oil/omega 3 for  HDL Will continue to follow        Morbid obesity (HCC)    Discussed how this problem influences overall health and the risks it imposes  Reviewed plan for weight loss with lower calorie diet (via better food choices and also portion control or program like weight watchers) and exercise building up to or more than 30 minutes 5 days per week including some aerobic activity   Will have to change to lower impact exercise for knees

## 2016-04-22 NOTE — Progress Notes (Signed)
Pre visit review using our clinic review tool, if applicable. No additional management support is needed unless otherwise documented below in the visit note. 

## 2016-04-22 NOTE — Patient Instructions (Signed)
For HDL - increase exercise (lower impact) For liver- minimize tylenol and also work on low fat diet and weight loss For LDL - Avoid red meat/ fried foods/ egg yolks/ fatty breakfast meats/ butter, cheese and high fat dairy/ and shellfish   Glucose was elevated mildly however -possible from your cortisone shot  Watch purines in diet for uric acid and do drink a lot of water to prevent gout   Follow up in 6 months

## 2016-04-23 NOTE — Assessment & Plan Note (Signed)
Discussed how this problem influences overall health and the risks it imposes  Reviewed plan for weight loss with lower calorie diet (via better food choices and also portion control or program like weight watchers) and exercise building up to or more than 30 minutes 5 days per week including some aerobic activity   Will have to change to lower impact exercise for knees

## 2016-04-23 NOTE — Assessment & Plan Note (Signed)
bp in fair control at this time  BP Readings from Last 1 Encounters:  04/22/16 128/84   No changes needed Disc lifstyle change with low sodium diet and exercise  Labs reviewed from [facility] Wt loss enc

## 2016-04-23 NOTE — Assessment & Plan Note (Signed)
Disc low sat fat diet for LDL  Exercise and fish/ fish oil/omega 3 for HDL Will continue to follow

## 2016-04-23 NOTE — Assessment & Plan Note (Signed)
This was just after a cortisone shot-suspect that was the cause  Will continue to follow Low glycemic diet and wt loss end to prevent DM2

## 2016-04-23 NOTE — Assessment & Plan Note (Signed)
Uric acid was high-but no attacks lately Disc low purine diet and handout given  Enc good water intake

## 2016-10-23 ENCOUNTER — Ambulatory Visit: Payer: BLUE CROSS/BLUE SHIELD | Admitting: Family Medicine

## 2016-11-09 ENCOUNTER — Encounter: Payer: Self-pay | Admitting: Family Medicine

## 2016-11-09 ENCOUNTER — Ambulatory Visit (INDEPENDENT_AMBULATORY_CARE_PROVIDER_SITE_OTHER): Payer: BLUE CROSS/BLUE SHIELD | Admitting: Family Medicine

## 2016-11-09 VITALS — BP 126/80 | HR 69 | Temp 98.7°F | Ht 71.5 in | Wt 309.2 lb

## 2016-11-09 DIAGNOSIS — R2241 Localized swelling, mass and lump, right lower limb: Secondary | ICD-10-CM | POA: Diagnosis not present

## 2016-11-09 DIAGNOSIS — Z23 Encounter for immunization: Secondary | ICD-10-CM

## 2016-11-09 DIAGNOSIS — R739 Hyperglycemia, unspecified: Secondary | ICD-10-CM | POA: Diagnosis not present

## 2016-11-09 DIAGNOSIS — I10 Essential (primary) hypertension: Secondary | ICD-10-CM

## 2016-11-09 NOTE — Progress Notes (Signed)
Subjective:    Patient ID: Samuel Murray, male    DOB: 04/18/1976, 40 y.o.   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    Wt Readings from Last 3 Encounters:  11/09/16 (!) 309 lb 4 oz (140.3 kg)  04/22/16 (!) 304 lb 8 oz (138.1 kg)  12/06/15 (!) 307 lb 8 oz (139.5 kg)   bmi is 42.5-still in the morbidly obese category Still struggling with knee problems/for exercise  Had another synvisc shot in knee -did not work as well  He is not ready for a knee replacement  He still runs a bit when he can (really loves to run) -- tends to sprint /walk combo 3 d per week  Elliptical other days - that does not hurt him   Eating - he is now cooking food one week in advance (using containers for portion control)  It is a lot of work once per week and then and then easy to stick with  Has a garden - uses that a lot  Too early to tell if he has lost wt yet   He eats a lot of frozen grapes at night   He has done triathalons in the past   bp is stable today  No cp or palpitations or headaches or edema  No side effects to medicines  BP Readings from Last 3 Encounters:  11/09/16 126/80  04/22/16 128/84  12/06/15 134/90    Well controlled with hctz 25  Hx of elevated blood glucose in the past but pt had a recent cortisone shot at that time    Hx of low HDL also Exercise -is excellent  Much better diet for cholesterol now - lot of lean protein   Has a lump on inner R thigh- irritates to run  ? Cyst or LN  Patient Active Problem List   Diagnosis Date Noted  . Lump of right thigh 11/09/2016  . Hyperglycemia 04/22/2016  . Low HDL (under 40) 04/22/2016  . Routine general medical examination at a health care facility 12/06/2015  . Depression 03/13/2015  . Essential hypertension 03/13/2015  . Gout 03/06/2015  . GERD (gastroesophageal reflux disease) 02/05/2013  . Morbid obesity (HCC)  03/01/2008  . ADD 02/01/2008  . ALLERGIC RHINITIS 02/01/2008  . ASTHMA 02/01/2008   Past Medical History:  Diagnosis Date  . Allergic rhinitis   . Asthma   . Attention deficit disorder 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   . Depression   . Elevated blood pressure reading without diagnosis of hypertension   . Obesity, unspecified    Past Surgical History:  Procedure Laterality Date  . ANKLE SURGERY    . TONSILLECTOMY AND ADENOIDECTOMY     Social History  Substance Use Topics  . Smoking status: Former Smoker    Quit date: 12/28/2000  . Smokeless tobacco: Never Used  . Alcohol use No   Family History  Problem Relation Age of Onset  . Hypertension Father   . Other Father     heart problems  . Heart disease Father   . Hypothyroidism Sister   . Thyroid disease Sister     hypothyroid   Allergies  Allergen Reactions  . Escitalopram Oxalate     REACTION: sedation  . Prozac [Fluoxetine Hcl]     Wt gain    Current Outpatient Prescriptions on File Prior to Visit  Medication Sig Dispense Refill  . albuterol (PROVENTIL HFA;VENTOLIN HFA) 108 (90 BASE) MCG/ACT inhaler Inhale 2 puffs into the lungs every 4 (four) hours as needed. 3 Inhaler 11  . fluticasone (FLONASE) 50 MCG/ACT nasal spray Place 2 sprays into both nostrils daily. 48 g 3  . hydrochlorothiazide (HYDRODIURIL) 25 MG tablet Take 1 tablet (25 mg total) by mouth daily. 30 tablet 11  . lansoprazole (PREVACID) 30 MG capsule Take 1 capsule (30 mg total) by mouth daily. 90 capsule 3  . sertraline (ZOLOFT) 50 MG tablet TAKE ONE TABLET ONCE DAILY 90 tablet 3   No current facility-administered medications on file prior to visit.      Review of Systems Review of Systems  Constitutional: Negative for fever, appetite change, fatigue and pos for wt gain  Eyes: Negative for pain and visual disturbance.  Respiratory: Negative for cough and shortness of breath.   Cardiovascular: Negative for cp or palpitations    Gastrointestinal:  Negative for nausea, diarrhea and constipation.  Genitourinary: Negative for urgency and frequency.  Skin: Negative for pallor or rash   MSK pos for chronic knee pain  Neurological: Negative for weakness, light-headedness, numbness and headaches.  Hematological: Negative for adenopathy. Does not bruise/bleed easily.  Psychiatric/Behavioral: Negative for dysphoric mood. The patient is not nervous/anxious.         Objective:   Physical Exam  Constitutional: He appears well-developed and well-nourished. No distress.  Well appearing / obese and very large/muscular build   HENT:  Head: Normocephalic and atraumatic.  Mouth/Throat: Oropharynx is clear and moist.  Eyes: Conjunctivae and EOM are normal. Pupils are equal, round, and reactive to light.  Neck: Normal range of motion. Neck supple. No JVD present. Carotid bruit is not present. No thyromegaly present.  Cardiovascular: Normal rate, regular rhythm, normal heart sounds and intact distal pulses.  Exam reveals no gallop.   Pulmonary/Chest: Effort normal and breath sounds normal. No respiratory distress. He has no wheezes. He has no rales.  No crackles  Abdominal: Soft. Bowel sounds are normal. He exhibits no distension, no abdominal bruit and no mass. There is no tenderness.  Musculoskeletal: He exhibits no edema.  Lymphadenopathy:    He has no cervical adenopathy.  Neurological: He is alert. He has normal reflexes.  Skin: Skin is warm and dry. No rash noted.  1-2 cm firm knot/lump in R inner thigh area that is slt tender No erythema or warmth or drainage   Psychiatric: He has a normal mood and affect.          Assessment & Plan:   Problem List Items Addressed This Visit      Cardiovascular and Mediastinum   Essential hypertension - Primary    bp in fair control at this time  BP Readings from Last 1 Encounters:  11/09/16 126/80   No changes needed Disc lifstyle change with low sodium diet and exercise  Enc him to keep  working on weight loss          Other   Hyperglycemia    Disc low glycemic diet  Will cut back on fruit serving size  Continue exercise Plan on A1C for his next visit       Lump of right thigh    This resembles a sebaceous cyst  Not red /inflammed or infected appearing-but is mildly tender Ref to derm  This is in an area of friction between thighs and is bothersome to pt       Relevant Orders  Ambulatory referral to Dermatology   Morbid obesity [facility])    Discussed how this problem influences overall health and the risks it imposes  Reviewed plan for weight loss with lower calorie diet (via better food choices and also portion control or program like weight watchers) and exercise building up to or more than 30 minutes 5 days per week including some aerobic activity   Once again-due to muscular build and fitness level his BMI may be deceiving (somewhat) That being said- I applaud his efforts        Other Visit Diagnoses    Need for influenza vaccination       Relevant Orders   Flu Vaccine QUAD 36+ mos IM (Completed)

## 2016-11-09 NOTE — Assessment & Plan Note (Signed)
This resembles a sebaceous cyst  Not red /inflammed or infected appearing-but is mildly tender Ref to derm  This is in an area of friction between thighs and is bothersome to pt

## 2016-11-09 NOTE — Progress Notes (Signed)
Pre visit review using our clinic review tool, if applicable. No additional management support is needed unless otherwise documented below in the visit note. 

## 2016-11-09 NOTE — Patient Instructions (Addendum)
Stop at check out for dermatology referral  Blood pressure is well controlled  Do continue a healthy diet / keep watching portions and eating lean protein , cutting back on simple carbohydrates (fruit is good but do not overdo it)  Keep exercising  Follow up in 6 months with labs prior for annual exam  Flu shot today

## 2016-11-09 NOTE — Assessment & Plan Note (Signed)
Disc low glycemic diet  Will cut back on fruit serving size  Continue exercise Plan on A1C for his next visit

## 2016-11-09 NOTE — Assessment & Plan Note (Addendum)
bp in fair control at this time  BP Readings from Last 1 Encounters:  11/09/16 126/80   No changes needed Disc lifstyle change with low sodium diet and exercise  Enc him to keep working on weight loss

## 2016-11-09 NOTE — Assessment & Plan Note (Signed)
Discussed how this problem influences overall health and the risks it imposes  Reviewed plan for weight loss with lower calorie diet (via better food choices and also portion control or program like weight watchers) and exercise building up to or more than 30 minutes 5 days per week including some aerobic activity   Once again-due to muscular build and fitness level his BMI may be deceiving (somewhat) That being said- I applaud his efforts

## 2016-11-10 ENCOUNTER — Encounter: Payer: Self-pay | Admitting: Family Medicine

## 2016-11-12 ENCOUNTER — Other Ambulatory Visit: Payer: Self-pay | Admitting: Family Medicine

## 2017-01-21 ENCOUNTER — Other Ambulatory Visit: Payer: Self-pay | Admitting: Family Medicine

## 2017-02-09 ENCOUNTER — Other Ambulatory Visit: Payer: Self-pay | Admitting: Family Medicine

## 2017-02-23 ENCOUNTER — Other Ambulatory Visit: Payer: Self-pay | Admitting: Family Medicine

## 2017-05-03 ENCOUNTER — Telehealth: Payer: Self-pay | Admitting: Family Medicine

## 2017-05-03 ENCOUNTER — Encounter: Payer: Self-pay | Admitting: Family Medicine

## 2017-05-03 DIAGNOSIS — R739 Hyperglycemia, unspecified: Secondary | ICD-10-CM

## 2017-05-03 DIAGNOSIS — Z0000 Encounter for general adult medical examination without abnormal findings: Secondary | ICD-10-CM

## 2017-05-03 NOTE — Telephone Encounter (Signed)
-----   Message from Alvina Chouerri J Walsh sent at 05/03/2017  2:44 PM EDT ----- Regarding: Lab orders for Monday, 5.14.18 Patient is scheduled for CPX labs, please order future labs, Thanks , Camelia Engerri

## 2017-05-05 ENCOUNTER — Other Ambulatory Visit: Payer: Self-pay | Admitting: Family Medicine

## 2017-05-10 ENCOUNTER — Other Ambulatory Visit (INDEPENDENT_AMBULATORY_CARE_PROVIDER_SITE_OTHER): Payer: BLUE CROSS/BLUE SHIELD

## 2017-05-10 DIAGNOSIS — Z0000 Encounter for general adult medical examination without abnormal findings: Secondary | ICD-10-CM

## 2017-05-10 DIAGNOSIS — R739 Hyperglycemia, unspecified: Secondary | ICD-10-CM | POA: Diagnosis not present

## 2017-05-10 LAB — COMPREHENSIVE METABOLIC PANEL
ALBUMIN: 4.5 g/dL (ref 3.5–5.2)
ALT: 41 U/L (ref 0–53)
AST: 25 U/L (ref 0–37)
Alkaline Phosphatase: 64 U/L (ref 39–117)
BILIRUBIN TOTAL: 0.7 mg/dL (ref 0.2–1.2)
BUN: 18 mg/dL (ref 6–23)
CO2: 29 mEq/L (ref 19–32)
CREATININE: 1.07 mg/dL (ref 0.40–1.50)
Calcium: 9.3 mg/dL (ref 8.4–10.5)
Chloride: 104 mEq/L (ref 96–112)
GFR: 80.23 mL/min (ref >60.00)
GLUCOSE: 102 mg/dL — AB (ref 70–99)
POTASSIUM: 3.8 meq/L (ref 3.5–5.1)
SODIUM: 141 meq/L (ref 135–145)
TOTAL PROTEIN: 7.1 g/dL (ref 6.0–8.3)

## 2017-05-10 LAB — CBC WITH DIFFERENTIAL/PLATELET
BASOS PCT: 0.6 % (ref 0.0–3.0)
Basophils Absolute: 0 10*3/uL (ref 0.0–0.1)
EOS ABS: 0.1 10*3/uL (ref 0.0–0.7)
EOS PCT: 2.3 % (ref 0.0–5.0)
HEMATOCRIT: 45.9 % (ref 39.0–52.0)
Hemoglobin: 15.7 g/dL (ref 13.0–17.0)
LYMPHS PCT: 39 % (ref 12.0–46.0)
Lymphs Abs: 2.2 10*3/uL (ref 0.7–4.0)
MCHC: 34.2 g/dL (ref 30.0–36.0)
MCV: 86.5 fl (ref 78.0–100.0)
MONOS PCT: 7.6 % (ref 3.0–12.0)
Monocytes Absolute: 0.4 10*3/uL (ref 0.1–1.0)
NEUTROS PCT: 50.5 % (ref 43.0–77.0)
Neutro Abs: 2.9 10*3/uL (ref 1.4–7.7)
Platelets: 194 10*3/uL (ref 150.0–400.0)
RBC: 5.3 Mil/uL (ref 4.22–5.81)
RDW: 13.8 % (ref 11.5–15.5)
WBC: 5.8 10*3/uL (ref 4.0–10.5)

## 2017-05-10 LAB — LIPID PANEL
CHOL/HDL RATIO: 5
Cholesterol: 159 mg/dL (ref 0–200)
HDL: 35.3 mg/dL — ABNORMAL LOW (ref >39.00)
LDL Cholesterol: 101 mg/dL — ABNORMAL HIGH (ref 0–99)
NONHDL: 123.77
Triglycerides: 114 mg/dL (ref 0.0–149.0)
VLDL: 22.8 mg/dL (ref 0.0–40.0)

## 2017-05-10 LAB — TSH: TSH: 2.14 u[IU]/mL (ref 0.35–4.50)

## 2017-05-10 LAB — HEMOGLOBIN A1C: HEMOGLOBIN A1C: 6 % (ref 4.6–6.5)

## 2017-05-12 ENCOUNTER — Encounter: Payer: Self-pay | Admitting: Family Medicine

## 2017-05-12 ENCOUNTER — Ambulatory Visit (INDEPENDENT_AMBULATORY_CARE_PROVIDER_SITE_OTHER): Payer: BLUE CROSS/BLUE SHIELD | Admitting: Family Medicine

## 2017-05-12 VITALS — BP 130/78 | HR 66 | Temp 98.4°F | Ht 72.0 in | Wt 306.2 lb

## 2017-05-12 DIAGNOSIS — M5136 Other intervertebral disc degeneration, lumbar region: Secondary | ICD-10-CM

## 2017-05-12 DIAGNOSIS — E786 Lipoprotein deficiency: Secondary | ICD-10-CM | POA: Diagnosis not present

## 2017-05-12 DIAGNOSIS — F3289 Other specified depressive episodes: Secondary | ICD-10-CM

## 2017-05-12 DIAGNOSIS — R739 Hyperglycemia, unspecified: Secondary | ICD-10-CM | POA: Diagnosis not present

## 2017-05-12 DIAGNOSIS — I10 Essential (primary) hypertension: Secondary | ICD-10-CM

## 2017-05-12 DIAGNOSIS — Z0000 Encounter for general adult medical examination without abnormal findings: Secondary | ICD-10-CM

## 2017-05-12 DIAGNOSIS — M51369 Other intervertebral disc degeneration, lumbar region without mention of lumbar back pain or lower extremity pain: Secondary | ICD-10-CM | POA: Insufficient documentation

## 2017-05-12 MED ORDER — LANSOPRAZOLE 30 MG PO CPDR: DELAYED_RELEASE_CAPSULE | ORAL | 3 refills | Status: DC

## 2017-05-12 MED ORDER — FLUTICASONE PROPIONATE 50 MCG/ACT NA SUSP: 2.0000 | Freq: Every day | NASAL | 3 refills | Status: DC

## 2017-05-12 MED ORDER — HYDROCHLOROTHIAZIDE 25 MG PO TABS: 25.0000 mg | ORAL_TABLET | Freq: Every day | ORAL | 3 refills | Status: DC

## 2017-05-12 MED ORDER — SERTRALINE HCL 50 MG PO TABS: 50.0000 mg | ORAL_TABLET | Freq: Every day | ORAL | 3 refills | Status: DC

## 2017-05-12 NOTE — Progress Notes (Signed)
Subjective:    Patient ID: Samuel Murray, male    DOB: 07-Jul-1976, 41 y.o.   MRN: [id number]  HPI  Here for health maintenance exam and to review chronic medical problems    Doing well  Very busy  A lot of stress with work  - is looking for a new job/ ready to move on   Ongoing back problems  Has seen a chiropractor - and does a lot of stretching  Worse after he walks a lot  Some tingling in legs when he bends forward  Hx of degenerative disc problems    Wt Readings from Last 3 Encounters:  05/12/17 (!) 306 lb 4 oz (138.9 kg)  11/09/16 (!) 309 lb 4 oz (140.3 kg)  04/22/16 (!) 304 lb 8 oz (138.1 kg)  not a lot of time for self care with work  Back problems are affecting his exercise -but he can do elliptical machine  bmi 41.5  Had labs at [facility] 1.5 mo ago  Glucose was 116 -he has started drinking vinegar three times per day  Tot chol 171   HIV screening -not interested   Flu vaccine 11/17  Tetanus shot 3/11  bp is stable today  No cp or palpitations or headaches or edema  No side effects to medicines  BP Readings from Last 3 Encounters:  05/12/17 130/78  11/09/16 126/80  04/22/16 128/84      Hx of hyperglycemia  Lab Results  Component Value Date   HGBA1C 6.0 05/10/2017  pre diabetic  Plans on a lower glycemic diet Has always struggled to loose weight     Hx of low HDL Lab Results  Component Value Date   CHOL 159 05/10/2017   CHOL 200 03/13/2015   CHOL 187 02/11/2010   Lab Results  Component Value Date   HDL 35.30 (L) 05/10/2017   HDL 34.40 (L) 03/13/2015   HDL 35.60 (L) 02/11/2010   Lab Results  Component Value Date   LDLCALC 101 (H) 05/10/2017   LDLCALC 131 (H) 03/13/2015   Lab Results  Component Value Date   TRIG 114.0 05/10/2017   TRIG 171.0 (H) 03/13/2015   TRIG 226.0 (H) 02/11/2010   Lab Results  Component Value Date   CHOLHDL 5 05/10/2017   CHOLHDL 6 03/13/2015   CHOLHDL 5 02/11/2010   Lab Results  Component Value Date     LDLDIRECT 132.0 02/11/2010   Overall -cholesterol improved Avoids red meat/eating chicken Does healthy meal prep   Results for orders placed or performed in visit on 05/10/17  CBC with Differential/Platelet  Result Value Ref Range   WBC 5.8 4.0 - 10.5 K/uL   RBC 5.30 4.22 - 5.81 Mil/uL   Hemoglobin 15.7 13.0 - 17.0 g/dL   HCT 40.9 81.1 - 91.4 %   MCV 86.5 78.0 - 100.0 fl   MCHC 34.2 30.0 - 36.0 g/dL   RDW 78.2 95.6 - 21.3 %   Platelets 194.0 150.0 - 400.0 K/uL   Neutrophils Relative % 50.5 43.0 - 77.0 %   Lymphocytes Relative 39.0 12.0 - 46.0 %   Monocytes Relative 7.6 3.0 - 12.0 %   Eosinophils Relative 2.3 0.0 - 5.0 %   Basophils Relative 0.6 0.0 - 3.0 %   Neutro Abs 2.9 1.4 - 7.7 K/uL   Lymphs Abs 2.2 0.7 - 4.0 K/uL   Monocytes Absolute 0.4 0.1 - 1.0 K/uL   Eosinophils Absolute 0.1 0.0 - 0.7 K/uL   Basophils Absolute 0.0  0.0 - 0.1 K/uL  Comprehensive metabolic panel  Result Value Ref Range   Sodium 141 135 - 145 mEq/L   Potassium 3.8 3.5 - 5.1 mEq/L   Chloride 104 96 - 112 mEq/L   CO2 29 19 - 32 mEq/L   Glucose, Bld 102 (H) 70 - 99 mg/dL   BUN 18 6 - 23 mg/dL   Creatinine, Ser 1.611.07 0.40 - 1.50 mg/dL   Total Bilirubin 0.7 0.2 - 1.2 mg/dL   Alkaline Phosphatase 64 39 - 117 U/L   AST 25 0 - 37 U/L   ALT 41 0 - 53 U/L   Total Protein 7.1 6.0 - 8.3 g/dL   Albumin 4.5 3.5 - 5.2 g/dL   Calcium 9.3 8.4 - 09.610.5 mg/dL   GFR 04.5480.23 >09.81>60.00 mL/min  Hemoglobin A1c  Result Value Ref Range   Hgb A1c MFr Bld 6.0 4.6 - 6.5 %  Lipid panel  Result Value Ref Range   Cholesterol 159 0 - 200 mg/dL   Triglycerides 191.4114.0 0.0 - 149.0 mg/dL   HDL 78.2935.30 (L) >56.21>39.00 mg/dL   VLDL 30.822.8 0.0 - 65.740.0 mg/dL   LDL Cholesterol 846101 (H) 0 - 99 mg/dL   Total CHOL/HDL Ratio 5    NonHDL 123.77   TSH  Result Value Ref Range   TSH 2.14 0.35 - 4.50 uIU/mL     Patient Active Problem List   Diagnosis Date Noted  . DDD (degenerative disc disease), lumbar 05/12/2017  . Lump of right thigh 11/09/2016   . Hyperglycemia 04/22/2016  . Low HDL (under 40) 04/22/2016  . Routine general medical examination at a health care facility 12/06/2015  . Depression 03/13/2015  . Essential hypertension 03/13/2015  . Gout 03/06/2015  . GERD (gastroesophageal reflux disease) 02/05/2013  . Morbid obesity (HCC) 03/01/2008  . ADD 02/01/2008  . ALLERGIC RHINITIS 02/01/2008  . ASTHMA 02/01/2008   Past Medical History:  Diagnosis Date  . Allergic rhinitis   . Asthma   . Attention deficit disorder 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   . Depression   . Elevated blood pressure reading without diagnosis of hypertension   . Obesity, unspecified    Past Surgical History:  Procedure Laterality Date  . ANKLE SURGERY    . TONSILLECTOMY AND ADENOIDECTOMY     Social History  Substance Use Topics  . Smoking status: Former Smoker    Quit date: 12/28/2000  . Smokeless tobacco: Never Used  . Alcohol use No   Family History  Problem Relation Age of Onset  . Hypertension Father   . Other Father        heart problems  . Heart disease Father   . Hypothyroidism Sister   . Thyroid disease Sister        hypothyroid   Allergies  Allergen Reactions  . Escitalopram Oxalate     REACTION: sedation  . Prozac [Fluoxetine Hcl]     Wt gain    Current Outpatient Prescriptions on File Prior to Visit  Medication Sig Dispense Refill  . VENTOLIN HFA 108 (90 Base) MCG/ACT inhaler INHALE 2 PUFFS INTO THE LUNGS EVERY 4 (FOUR) HOURS AS NEEDED. 54 g 0   No current facility-administered medications on file prior to visit.     Review of Systems Review of Systems  Constitutional: Negative for fever, appetite change, fatigue and unexpected weight change.  Eyes: Negative for pain and visual disturbance.  Respiratory: Negative for cough and shortness of breath.   Cardiovascular: Negative for cp or palpitations  Gastrointestinal: Negative for nausea, diarrhea and constipation.  Genitourinary: Negative for urgency and  frequency.  Skin: Negative for pallor or rash   MSK pos for acute on chronic low back pain  Neurological: Negative for weakness, light-headedness, numbness and headaches.  Hematological: Negative for adenopathy. Does not bruise/bleed easily.  Psychiatric/Behavioral: Negative for dysphoric mood. The patient is not nervous/anxious.         Objective:   Physical Exam  Constitutional: He appears well-developed and well-nourished. No distress.  obese and well appearing   HENT:  Head: Normocephalic and atraumatic.  Right Ear: External ear normal.  Left Ear: External ear normal.  Nose: Nose normal.  Mouth/Throat: Oropharynx is clear and moist.  Eyes: Conjunctivae and EOM are normal. Pupils are equal, round, and reactive to light. Right eye exhibits no discharge. Left eye exhibits no discharge. No scleral icterus.  Neck: Normal range of motion. Neck supple. No JVD present. Carotid bruit is not present. No thyromegaly present.  Cardiovascular: Normal rate, regular rhythm, normal heart sounds and intact distal pulses.  Exam reveals no gallop.   Pulmonary/Chest: Effort normal and breath sounds normal. No respiratory distress. He has no wheezes. He exhibits no tenderness.  Abdominal: Soft. Bowel sounds are normal. He exhibits no distension, no abdominal bruit and no mass. There is no tenderness.  Musculoskeletal: He exhibits no edema or tenderness.  Limited rom of LS   Lymphadenopathy:    He has no cervical adenopathy.  Neurological: He is alert. He has normal reflexes. No cranial nerve deficit. He exhibits normal muscle tone. Coordination normal.  Skin: Skin is warm and dry. No rash noted. No erythema. No pallor.  Some brown nevi and lentigines   Psychiatric: He has a normal mood and affect.  Talkative and cheerful today  Disc stress of job          Assessment & Plan:   Problem List Items Addressed This Visit      Cardiovascular and Mediastinum   Essential hypertension - Primary     bp in fair control at this time  BP Readings from Last 1 Encounters:  05/12/17 130/78   No changes needed Disc lifstyle change with low sodium diet and exercise  Labs reviewed  Wt loss enc      Relevant Medications   hydrochlorothiazide (HYDRODIURIL) 25 MG tablet     Musculoskeletal and Integument   DDD (degenerative disc disease), lumbar    Ref to ortho at pt request - his chronic back pain is worsening       Relevant Orders   Ambulatory referral to Orthopedic Surgery     Other   Depression    Doing well with sertraline  Refilled  Reviewed stressors/ coping techniques/symptoms/ support sources/ tx options and side effects in detail today       Relevant Medications   sertraline (ZOLOFT) 50 MG tablet   Hyperglycemia    Lab Results  Component Value Date   HGBA1C 6.0 05/10/2017   disc imp of low glycemic diet and wt loss to prevent DM2       Low HDL (under 40)    Disc goals for lipids and reasons to control them Rev labs with pt Rev low sat fat diet in detail  Will work on inc exercise       Morbid obesity (HCC)    Discussed how this problem influences overall health and the risks it imposes  Reviewed plan for weight loss with lower calorie diet (via better food choices  and also portion control or program like weight watchers) and exercise building up to or more than 30 minutes 5 days per week including some aerobic activity         Routine general medical examination at a health care facility    Reviewed health habits including diet and exercise and skin cancer prevention Reviewed appropriate screening tests for age  Also reviewed health mt list, fam hx and immunization status , as well as social and family history   See HPI  Labs reviewed Plan made to work on wt loss

## 2017-05-12 NOTE — Patient Instructions (Addendum)
We will refer you to orthopedics for your back   As always-work on weight loss  Get your carbohydrates from produce- instead of bread, pasta,rice/snack foods, sweets, sugar drinks and juice  High fiber options are better  Avoid the middle of the grocery store  Scale back portions as well   Exercise -as tolerated /do what does not hurt Keep up yoga if you can also

## 2017-05-13 NOTE — Assessment & Plan Note (Signed)
Doing well with sertraline  Refilled  Reviewed stressors/ coping techniques/symptoms/ support sources/ tx options and side effects in detail today

## 2017-05-13 NOTE — Assessment & Plan Note (Signed)
Disc goals for lipids and reasons to control them Rev labs with pt Rev low sat fat diet in detail  Will work on inc exercise

## 2017-05-13 NOTE — Assessment & Plan Note (Signed)
Reviewed health habits including diet and exercise and skin cancer prevention Reviewed appropriate screening tests for age  Also reviewed health mt list, fam hx and immunization status , as well as social and family history   See HPI  Labs reviewed Plan made to work on wt loss

## 2017-05-13 NOTE — Assessment & Plan Note (Signed)
bp in fair control at this time  BP Readings from Last 1 Encounters:  05/12/17 130/78   No changes needed Disc lifstyle change with low sodium diet and exercise  Labs reviewed  Wt loss enc

## 2017-05-13 NOTE — Assessment & Plan Note (Signed)
Discussed how this problem influences overall health and the risks it imposes  Reviewed plan for weight loss with lower calorie diet (via better food choices and also portion control or program like weight watchers) and exercise building up to or more than 30 minutes 5 days per week including some aerobic activity    

## 2017-05-13 NOTE — Assessment & Plan Note (Signed)
Lab Results  Component Value Date   HGBA1C 6.0 05/10/2017   disc imp of low glycemic diet and wt loss to prevent DM2

## 2017-05-13 NOTE — Assessment & Plan Note (Signed)
Ref to ortho at pt request - his chronic back pain is worsening

## 2017-05-26 ENCOUNTER — Other Ambulatory Visit: Payer: Self-pay | Admitting: Orthopedic Surgery

## 2017-05-26 DIAGNOSIS — M5416 Radiculopathy, lumbar region: Secondary | ICD-10-CM

## 2017-06-04 ENCOUNTER — Ambulatory Visit
Admission: RE | Admit: 2017-06-04 | Discharge: 2017-06-04 | Disposition: A | Discharge status: Home / Self Care | Payer: BLUE CROSS/BLUE SHIELD | Source: Ambulatory Visit | Attending: Orthopedic Surgery | Admitting: Orthopedic Surgery

## 2017-06-04 DIAGNOSIS — M5416 Radiculopathy, lumbar region: Secondary | ICD-10-CM

## 2017-06-16 IMAGING — MR MR LUMBAR SPINE W/O CM
4 of 5 series · 18 of 48 positions shown · non-contrast
Comparison: None.

CLINICAL DATA: Low back pain since football related injury 20 years
ago. Bilateral hip pain with numbness in the legs.

EXAM:
MRI LUMBAR SPINE WITHOUT CONTRAST
TECHNIQUE: Multiplanar, multisequence MR imaging of the lumbar spine was
performed. No intravenous contrast was administered.

[Series 6: T2 · sagittal · 4.0mm · 0.73mm/px · 6 of 15 slices shown (1 of 2)]
[im 1/15]
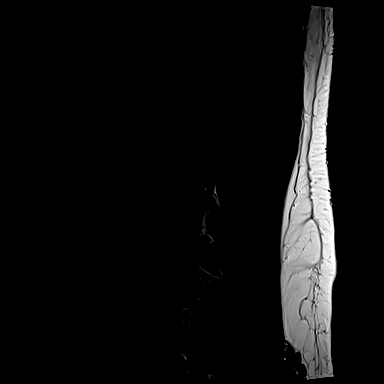
[im 3/15]
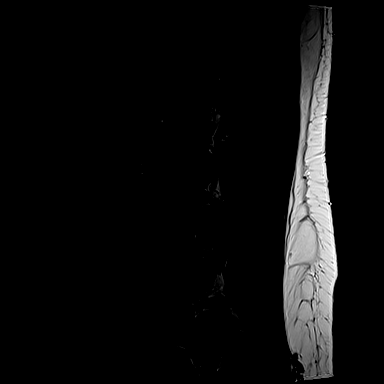
[im 6/15]
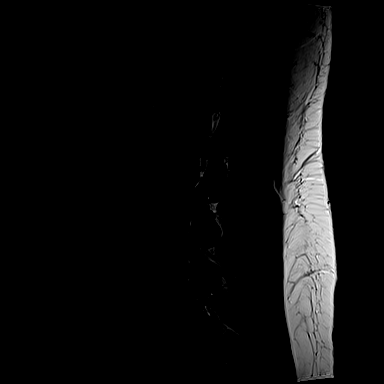
[im 9/15]
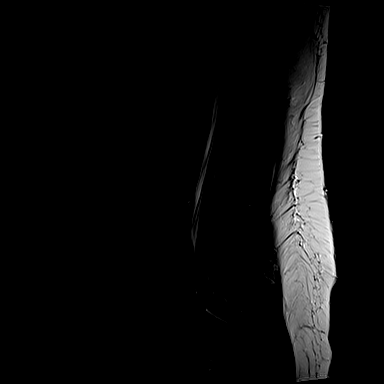
[im 12/15]
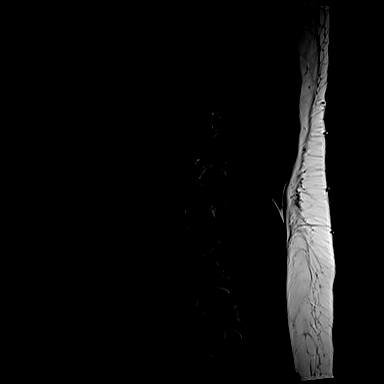
[im 15/15]
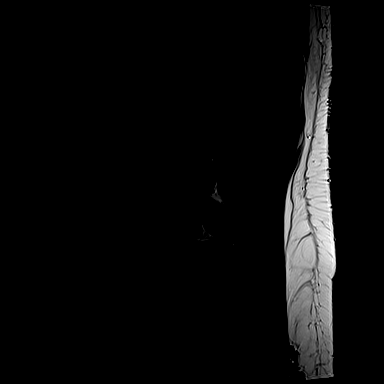

[Series 7: T1 · sagittal · 4.0mm · 0.73mm/px · 3 of 15 slices shown (1 of 2)]
[im 3/15]
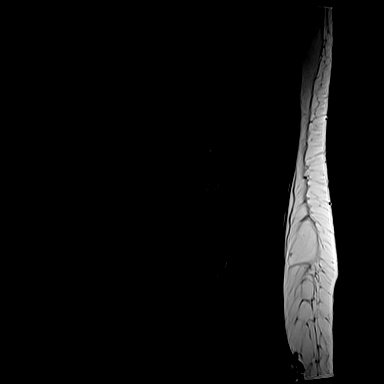
[im 9/15]
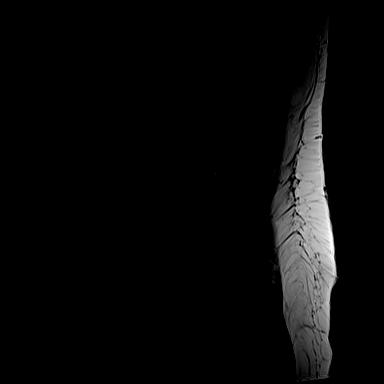
[im 15/15]
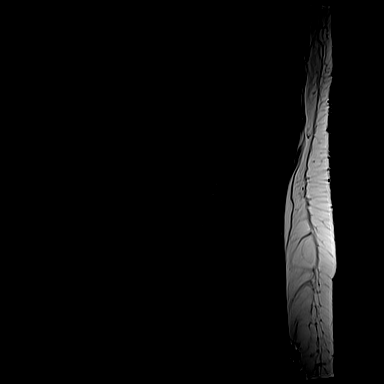

[Series 11: T1 · axial · 4.0mm · 0.28mm/px · z∈[-109,+34]mm · 3 of 39 slices shown (2 of 2)]
[im 6/39]
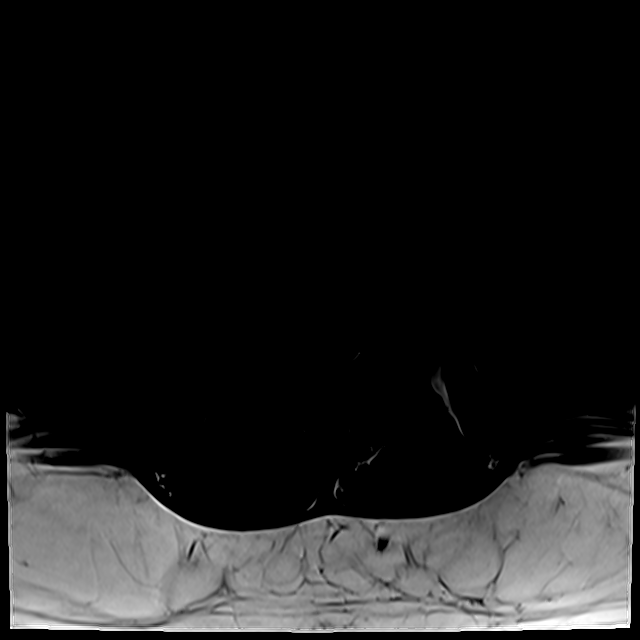
[im 20/39]
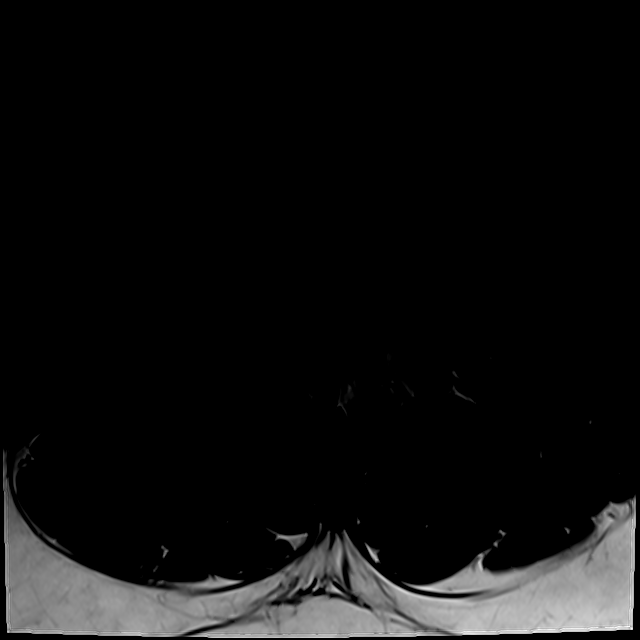
[im 33/39]
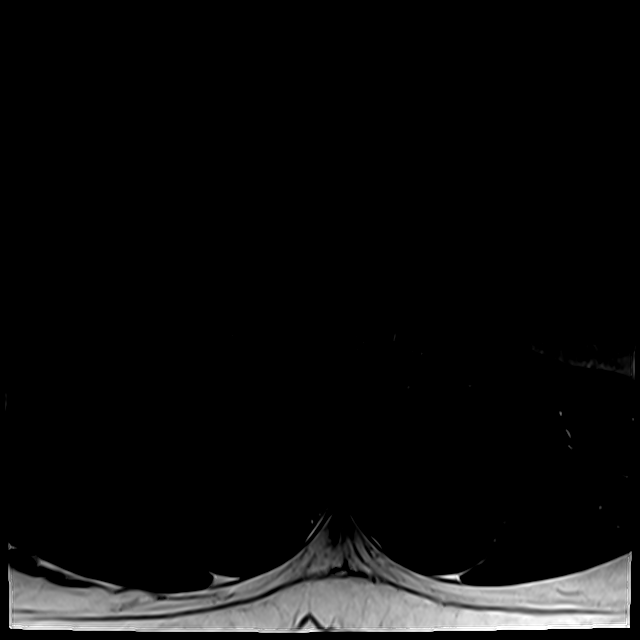

[Series 14: T2 · axial · 4.0mm · 0.28mm/px · z∈[-134,+34]mm · 6 of 39 slices shown (2 of 2)]
[im 1/39]
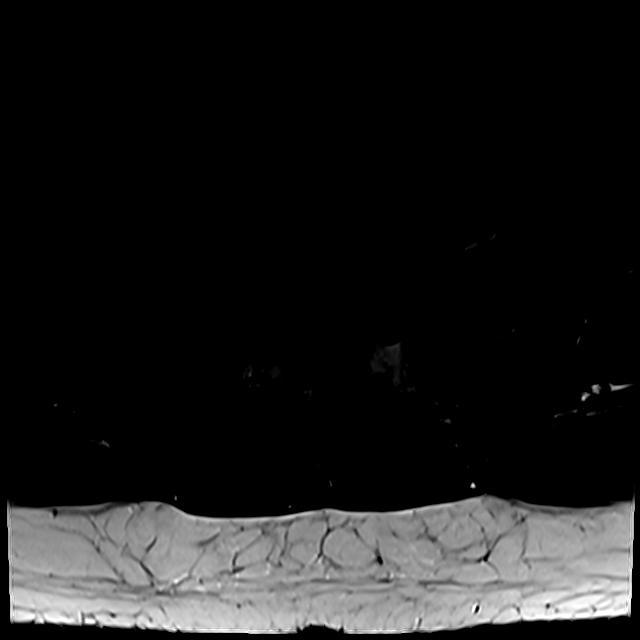
[im 6/39]
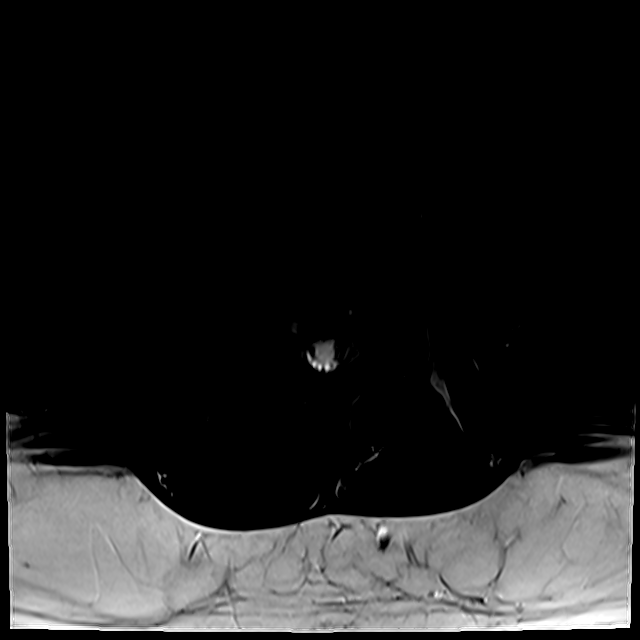
[im 11/39]
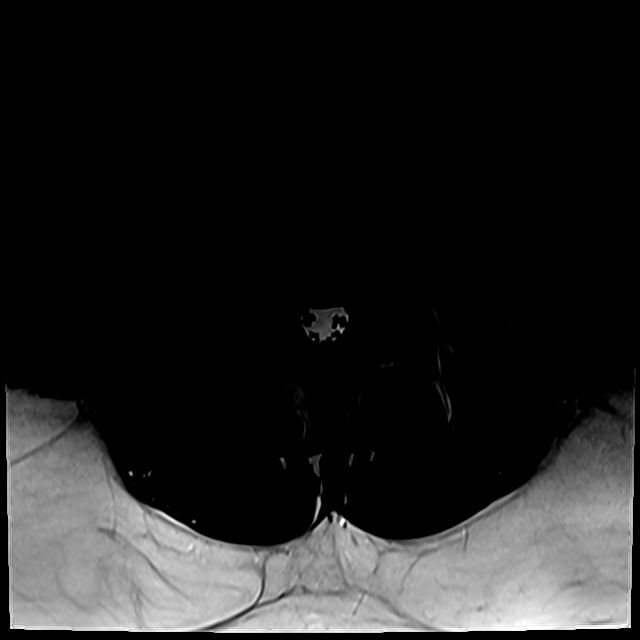
[im 17/39]
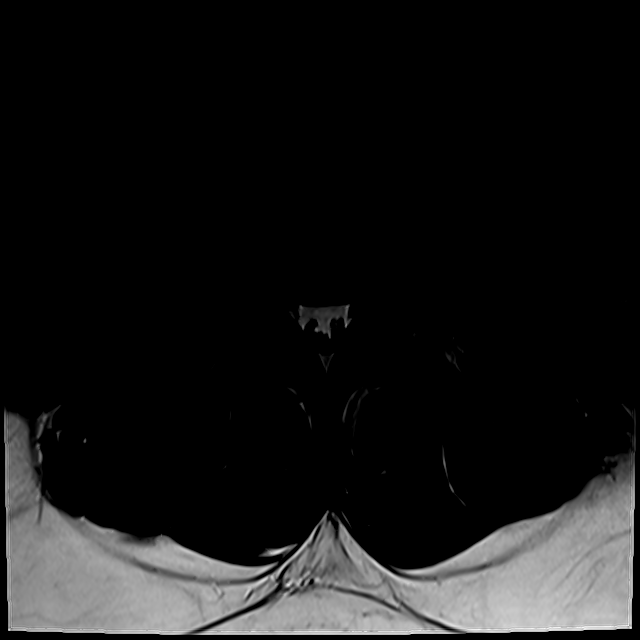
[im 20/39]
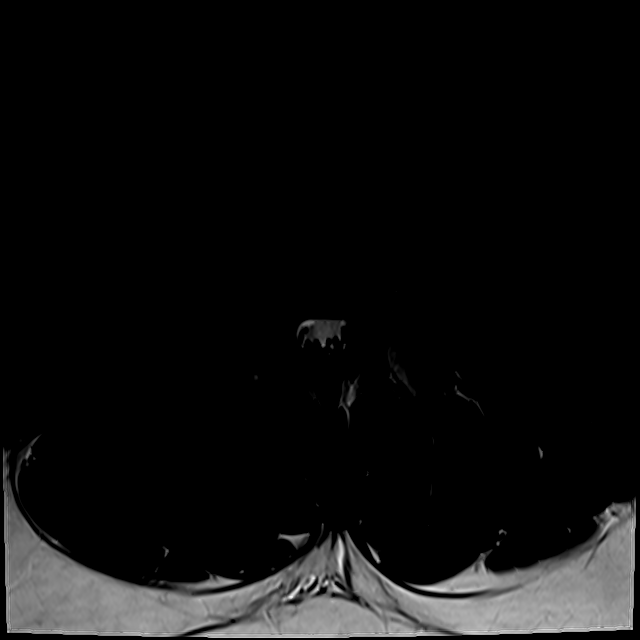
[im 33/39]
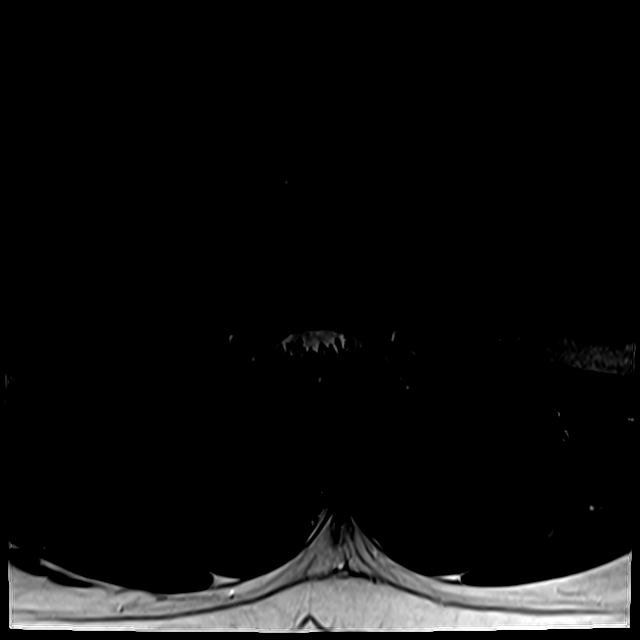

[18 of 48 positions shown; findings below may reference images not displayed]

FINDINGS: Segmentation:  5 lumbar type vertebral bodies.

Alignment:  2 mm anterolisthesis L5-S1 due to pars defects.

Vertebrae:  Bilateral pars defects L5.

Conus medullaris: Extends to the T12-L1 level and appears normal.

Paraspinal and other soft tissues: Negative

Disc levels:

No abnormality from T11-12 through L3-4.

L4-5: Disc degeneration with broad-based disc herniation with slight
caudal down turning. Stenosis of both lateral recesses that could
affect the L5 nerves. L4 nerves exit without compression.

L5-S1: Bilateral L5 pars defects with 2 mm of anterolisthesis.
Central disc herniation with slight upward turning. No compressive
effect upon the thecal sac or neural structures.
IMPRESSION: Bilateral pars defects at L5 with 2 mm of anterolisthesis. No
pronounced edematous change. L5-S1 disc herniation with slight
upward turning. No apparent compressive effect upon the thecal sac
or nerves however. Spinal canal quite large at this level.

Disc degeneration at L4-5 with a broad-based disc herniation with
slight caudal down turning. Stenosis of the lateral recesses that
could compress either or both L5 nerves.

## 2017-06-17 ENCOUNTER — Telehealth: Payer: Self-pay

## 2017-06-17 MED ORDER — COLCHICINE 0.6 MG PO TABS: 0.6000 mg | ORAL_TABLET | Freq: Two times a day (BID) | ORAL | 3 refills | Status: DC | PRN

## 2017-06-17 NOTE — Telephone Encounter (Signed)
Sent electronically F/u if no imp

## 2017-06-17 NOTE — Telephone Encounter (Addendum)
Pt left v/m; pt has flare up of gout in lt big toe; big toe red,swollen and painful.requestinig colcyrs [facility] [place]. Last annual 05/12/17.

## 2017-06-17 NOTE — Telephone Encounter (Signed)
Pt notified Rx sent and to f/u if no improvement  °

## 2017-10-06 ENCOUNTER — Ambulatory Visit (INDEPENDENT_AMBULATORY_CARE_PROVIDER_SITE_OTHER): Payer: 59 | Admitting: Family Medicine

## 2017-10-06 ENCOUNTER — Encounter: Payer: Self-pay | Admitting: Family Medicine

## 2017-10-06 VITALS — BP 138/88 | HR 56 | Temp 97.9°F

## 2017-10-06 DIAGNOSIS — R21 Rash and other nonspecific skin eruption: Secondary | ICD-10-CM

## 2017-10-06 MED ORDER — BETAMETHASONE DIPROPIONATE 0.05 % EX CREA: TOPICAL_CREAM | Freq: Two times a day (BID) | CUTANEOUS | 0 refills | Status: DC

## 2017-10-06 NOTE — Progress Notes (Signed)
   Subjective:    Patient ID: Samuel Murray, male    DOB: July 14, 1976, 41 y.o.   MRN: [id number]  HPI   This is a 41 yo male who presents today with rash on left foot x 1 month. Recently noticed similar area on upper right arm, and lower abdomen. He put OTC antifungal on area x 3-4 days without improvement. No pain or itching. No recent illness. Has never problems with rash in past. Works out at gym, Engineer, maintenance (IT). No new soaps, lotions, shampoo, detergent, etc.    Past Medical History:  Diagnosis Date  . Allergic rhinitis   . Asthma   . Attention deficit disorder 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   . Depression   . Elevated blood pressure reading without diagnosis of hypertension   . Obesity, unspecified    Past Surgical History:  Procedure Laterality Date  . ANKLE SURGERY    . TONSILLECTOMY AND ADENOIDECTOMY     Family History  Problem Relation Age of Onset  . Hypertension Father   . Other Father        heart problems  . Heart disease Father   . Hypothyroidism Sister   . Thyroid disease Sister        hypothyroid   Social History  Substance Use Topics  . Smoking status: Former Smoker    Quit date: 12/28/2000  . Smokeless tobacco: Never Used  . Alcohol use No      Review of Systems Per HPI    Objective:   Physical Exam  Constitutional: He is oriented to person, place, and time. He appears well-developed and well-nourished.  HENT:  Head: Normocephalic and atraumatic.  Cardiovascular: Normal rate.   Pulmonary/Chest: Effort normal.  Neurological: He is alert and oriented to person, place, and time.  Skin: Skin is warm and dry. Rash noted.  Large area of flat, irregularly shaped hyperpigmentation on top of left foot, few smaller separate scattered lesions. No flaking, no drainage.  Right upper, medial arm with few scattered, similar area. Lower abdomen with similar areas.   Psychiatric: He has a normal mood and affect. His behavior is normal. Judgment and  thought content normal.  Vitals reviewed.     BP (!) 142/92 (BP Location: Right Arm, Patient Position: Sitting, Cuff Size: Large)   Pulse (!) 56   Temp 97.9 F (36.6 C) (Oral)   SpO2 98%  Wt Readings from Last 3 Encounters:  05/12/17 (!) 306 lb 4 oz (138.9 kg)  11/09/16 (!) 309 lb 4 oz (140.3 kg)  04/22/16 (!) 304 lb 8 oz (138.1 kg)       Assessment & Plan:  1. Rash - has tried short trial of antifungal without improvement, will try steroid cream, if any worsening of rash, he will stop - Ambulatory referral to Dermatology - betamethasone dipropionate (DIPROLENE) 0.05 % cream; Apply topically 2 (two) times daily. For maximum 10 days.  Dispense: 45 g; Refill: 0   Olean Ree, FNP-BC  [facility], [facility]  10/07/2017 10:15 AM

## 2017-10-06 NOTE — Patient Instructions (Addendum)
Look at [email or web address].com  I have sent a steroid cream to your pharmacy and put in a referral to dermatology

## 2017-12-07 ENCOUNTER — Telehealth: Payer: Self-pay | Admitting: Family Medicine

## 2017-12-07 NOTE — Telephone Encounter (Signed)
Copied from CRM #[id number]. Topic: General - Other >> Dec 07, 2017  2:58 PM Samuel Murray, Samuel Murray, RMA wrote: Reason for CRM: Medication refill request for Hydrochlorothiazide 25 mg, lansoprazole 30 mg [facility]

## 2017-12-08 NOTE — Telephone Encounter (Signed)
Both meds last refilled 05/12/17 90 tabs for 3 refills

## 2017-12-08 NOTE — Telephone Encounter (Signed)
Left [facility]. It appears pharmacy has already been updated.

## 2018-03-02 ENCOUNTER — Encounter: Payer: Self-pay | Admitting: Family Medicine

## 2018-03-02 ENCOUNTER — Ambulatory Visit (INDEPENDENT_AMBULATORY_CARE_PROVIDER_SITE_OTHER): Payer: No Typology Code available for payment source | Admitting: Family Medicine

## 2018-03-02 VITALS — BP 128/86 | HR 60 | Temp 98.3°F | Ht 72.0 in

## 2018-03-02 DIAGNOSIS — M7711 Lateral epicondylitis, right elbow: Secondary | ICD-10-CM

## 2018-03-02 DIAGNOSIS — M7710 Lateral epicondylitis, unspecified elbow: Secondary | ICD-10-CM | POA: Insufficient documentation

## 2018-03-02 MED ORDER — DICLOFENAC SODIUM 75 MG PO TBEC: 75.0000 mg | DELAYED_RELEASE_TABLET | Freq: Two times a day (BID) | ORAL | 1 refills | Status: DC

## 2018-03-02 NOTE — Patient Instructions (Addendum)
Take diclofenac instead of aleve Take 1 pill twice daily with food (stop if it bothers your stomach)  Use ice when you can 10 minutes at a time Relative rest- move work station if needed/ avoid weight lifting for now   Take a look at the rehab handout   If not significantly improved in 2 weeks - let me know (we will set you up with sports med)

## 2018-03-02 NOTE — Progress Notes (Signed)
Subjective:    Patient ID: Samuel Murray, male    DOB: 02/18/1976, 42 y.o.   MRN: [id number]  HPI Here for R elbow pain  Has not had it before   Started about 3 months ago  Took aleve bid (1 pill bid)  Tens unit Ice and heat   Hurts to shake hands  Or work on computer  Or turning a door knob or lifting  Gripping hurts even when not using elbow   Hurts laterally  Not swelling  Not red or warm to the touch  No numbness  This may have started from weight lifting  Also computer work- is a lot      [employer] & Co[employer]t Readings from Last 3 Encounters:  05/12/17 (!) 306 lb 4 oz (138.9 kg)  11/09/16 (!) 309 lb 4 oz (140.3 kg)  04/22/16 (!) 304 lb 8 oz (138.1 kg)   41.53 kg/m   Was diagnosed with pars defect for back Had cortisone shot  Then on diclofenac for a while -it really helped   Patient Active Problem List   Diagnosis Date Noted  . Lateral epicondylitis 03/02/2018  . DDD (degenerative disc disease), lumbar 05/12/2017  . Lump of right thigh 11/09/2016  . Hyperglycemia 04/22/2016  . Low HDL (under 40) 04/22/2016  . Routine general medical examination at a health care facility 12/06/2015  . Depression 03/13/2015  . Essential hypertension 03/13/2015  . Gout 03/06/2015  . GERD (gastroesophageal reflux disease) 02/05/2013  . Morbid obesity (HCC) 03/01/2008  . ADD 02/01/2008  . ALLERGIC RHINITIS 02/01/2008  . ASTHMA 02/01/2008   Past Medical History:  Diagnosis Date  . Allergic rhinitis   . Asthma   . Attention deficit disorder 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   . Depression   . Elevated blood pressure reading without diagnosis of hypertension   . Obesity, unspecified    Past Surgical History:  Procedure Laterality Date  . ANKLE SURGERY    . TONSILLECTOMY AND ADENOIDECTOMY     Social History   Tobacco Use  . Smoking status: Former Smoker    Last attempt to quit: 12/28/2000    Years since quitting: 17.1  . Smokeless tobacco: Never Used  Substance Use Topics  .  Alcohol use: No    Alcohol/week: 0.0 oz  . Drug use: No   Family History  Problem Relation Age of Onset  . Hypertension Father   . Other Father        heart problems  . Heart disease Father   . Hypothyroidism Sister   . Thyroid disease Sister        hypothyroid   Allergies  Allergen Reactions  . Escitalopram Oxalate     REACTION: sedation  . Prozac [Fluoxetine Hcl]     Wt gain    Current Outpatient Medications on File Prior to Visit  Medication Sig Dispense Refill  . betamethasone dipropionate (DIPROLENE) 0.05 % cream Apply topically 2 (two) times daily. For maximum 10 days. 45 g 0  . colchicine 0.6 MG tablet Take 1 tablet (0.6 mg total) by mouth 2 (two) times daily as needed (for gout flare). 30 tablet 3  . fluticasone (FLONASE) 50 MCG/ACT nasal spray Place 2 sprays into both nostrils daily. 48 g 3  . hydrochlorothiazide (HYDRODIURIL) 25 MG tablet Take 1 tablet (25 mg total) by mouth daily. 90 tablet 3  . lansoprazole (PREVACID) 30 MG capsule TAKE 1 CAPSULE (30 MG TOTAL) BY MOUTH DAILY. 90 capsule 3  . loratadine (CLARITIN) 10  MG tablet Take by mouth.    . sertraline (ZOLOFT) 50 MG tablet Take 1 tablet (50 mg total) by mouth daily. 90 tablet 3  . VENTOLIN HFA 108 (90 Base) MCG/ACT inhaler INHALE 2 PUFFS INTO THE LUNGS EVERY 4 (FOUR) HOURS AS NEEDED. 54 g 0   No current facility-administered medications on file prior to visit.     Review of Systems  Constitutional: Negative for activity change, appetite change, fatigue, fever and unexpected weight change.  HENT: Negative for congestion, rhinorrhea, sore throat and trouble swallowing.   Eyes: Negative for pain, redness, itching and visual disturbance.  Respiratory: Negative for cough, chest tightness, shortness of breath and wheezing.   Cardiovascular: Negative for chest pain and palpitations.  Gastrointestinal: Negative for abdominal pain, blood in stool, constipation, diarrhea and nausea.  Endocrine: Negative for cold  intolerance, heat intolerance, polydipsia and polyuria.  Genitourinary: Negative for difficulty urinating, dysuria, frequency and urgency.  Musculoskeletal: Negative for arthralgias, joint swelling and myalgias.       Pos for R elbow pain w/o swelling   Skin: Negative for pallor and rash.  Neurological: Negative for dizziness, tremors, weakness, numbness and headaches.  Hematological: Negative for adenopathy. Does not bruise/bleed easily.  Psychiatric/Behavioral: Negative for decreased concentration and dysphoric mood. The patient is not nervous/anxious.        Objective:   Physical Exam  Constitutional: He appears well-developed and well-nourished. No distress.  obese and well appearing   HENT:  Head: Normocephalic and atraumatic.  Eyes: Conjunctivae and EOM are normal. Pupils are equal, round, and reactive to light. No scleral icterus.  Neck: Normal range of motion. Neck supple.  Cardiovascular: Normal rate, regular rhythm and normal heart sounds.  Pulmonary/Chest: Effort normal and breath sounds normal. No respiratory distress. He has no wheezes. He has no rales.  Musculoskeletal: He exhibits tenderness. He exhibits no edema.       Right elbow: He exhibits normal range of motion, no swelling, no effusion and no deformity. Tenderness found. Lateral epicondyle tenderness noted.  R elbow-nl rom with pain on pronation/supination of forearm and resisted flex of elbow Tender over lateral epicondyle   No neuro changes Nl grip   Lymphadenopathy:    He has no cervical adenopathy.  Neurological: He is alert. He has normal reflexes. He displays no atrophy and no tremor. No sensory deficit. He exhibits normal muscle tone.  Skin: Skin is warm and dry. No rash noted. No erythema.  Psychiatric: He has a normal mood and affect.          Assessment & Plan:   Problem List Items Addressed This Visit      Musculoskeletal and Integument   Lateral epicondylitis - Primary    R in R handed  male  May have started with weight lifting/ worse using mouse with new job  Disc use of forearm band- to get otc  Ice 10 min whenever able  Relative rest/change pos of work station if able Change aleve to diclofenac 75 mg bid with food  Rehab exercise handout given -disc exercises  If no imp in 2 wk call and will rec sport med appt      Relevant Medications   diclofenac (VOLTAREN) 75 MG EC tablet     Other   Morbid obesity (HCC)    Discussed how this problem influences overall health and the risks it imposes  Reviewed plan for weight loss with lower calorie diet (via better food choices and also portion control or program  like weight watchers) and exercise building up to or more than 30 minutes 5 days per week including some aerobic activity   Exercise is good Pt realizes he needs to work on diet -disc avoidance of processed carbs (more produce) and smaller portion size

## 2018-03-03 NOTE — Assessment & Plan Note (Signed)
R in R handed male  May have started with weight lifting/ worse using mouse with new job  Disc use of forearm band- to get otc  Ice 10 min whenever able  Relative rest/change pos of work station if able Change aleve to diclofenac 75 mg bid with food  Rehab exercise handout given -disc exercises  If no imp in 2 wk call and will rec sport med appt

## 2018-03-03 NOTE — Assessment & Plan Note (Signed)
Discussed how this problem influences overall health and the risks it imposes  Reviewed plan for weight loss with lower calorie diet (via better food choices and also portion control or program like weight watchers) and exercise building up to or more than 30 minutes 5 days per week including some aerobic activity   Exercise is good Pt realizes he needs to work on diet -disc avoidance of processed carbs (more produce) and smaller portion size

## 2018-05-19 ENCOUNTER — Encounter: Payer: Self-pay | Admitting: Family Medicine

## 2018-05-19 DIAGNOSIS — M79671 Pain in right foot: Secondary | ICD-10-CM

## 2018-05-19 DIAGNOSIS — M79672 Pain in left foot: Principal | ICD-10-CM

## 2018-05-19 NOTE — Telephone Encounter (Signed)
Ref for foot pain  Will send to [facility] See mychart message

## 2018-05-25 ENCOUNTER — Ambulatory Visit: Payer: No Typology Code available for payment source | Admitting: Family Medicine

## 2018-05-27 ENCOUNTER — Encounter: Payer: Self-pay | Admitting: Podiatry

## 2018-05-27 ENCOUNTER — Ambulatory Visit: Payer: No Typology Code available for payment source | Admitting: Podiatry

## 2018-05-27 ENCOUNTER — Ambulatory Visit: Payer: Self-pay

## 2018-05-27 ENCOUNTER — Ambulatory Visit (INDEPENDENT_AMBULATORY_CARE_PROVIDER_SITE_OTHER): Payer: No Typology Code available for payment source

## 2018-05-27 DIAGNOSIS — S99922A Unspecified injury of left foot, initial encounter: Secondary | ICD-10-CM

## 2018-05-27 DIAGNOSIS — M109 Gout, unspecified: Secondary | ICD-10-CM | POA: Diagnosis not present

## 2018-05-27 DIAGNOSIS — M205X2 Other deformities of toe(s) (acquired), left foot: Secondary | ICD-10-CM | POA: Diagnosis not present

## 2018-05-27 MED ORDER — MELOXICAM 15 MG PO TABS: 15.0000 mg | ORAL_TABLET | Freq: Every day | ORAL | 0 refills | Status: DC

## 2018-05-27 MED ORDER — METHYLPREDNISOLONE 4 MG PO TBPK: ORAL_TABLET | ORAL | 0 refills | Status: DC

## 2018-05-27 NOTE — Progress Notes (Signed)
Subjective:  Patient ID: Samuel Murray, male    DOB: Dec 10, 1976,  MRN: [id number]  Chief Complaint  Patient presents with  . Foot Pain    left foot injury x 3 weeks ago, while exercising. Xrays done 3 days ago   42 y.o. male presents with the above complaint.  States that he hurt his left foot approximately weeks ago while exercising.  Had x-rays done a few days ago but did not bring with him.  Is concerned about the pain that is going to [place] in a few weeks.  Reports a history of gout that has been confirmed with uric acid levels.  Past Medical History:  Diagnosis Date  . Allergic rhinitis   . Asthma   . Attention deficit disorder 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   . Depression   . Elevated blood pressure reading without diagnosis of hypertension   . Obesity, unspecified    Past Surgical History:  Procedure Laterality Date  . ANKLE SURGERY    . TONSILLECTOMY AND ADENOIDECTOMY      Current Outpatient Medications:  .  amoxicillin (AMOXIL) 500 MG capsule, , Disp: , Rfl: 0 .  betamethasone dipropionate (DIPROLENE) 0.05 % cream, Apply topically 2 (two) times daily. For maximum 10 days., Disp: 45 g, Rfl: 0 .  colchicine 0.6 MG tablet, Take 1 tablet (0.6 mg total) by mouth 2 (two) times daily as needed (for gout flare)., Disp: 30 tablet, Rfl: 3 .  diclofenac (VOLTAREN) 75 MG EC tablet, Take 1 tablet (75 mg total) by mouth 2 (two) times daily. With food for elbow pain, Disp: 60 tablet, Rfl: 1 .  fluticasone (FLONASE) 50 MCG/ACT nasal spray, Place 2 sprays into both nostrils daily., Disp: 48 g, Rfl: 3 .  hydrochlorothiazide (HYDRODIURIL) 25 MG tablet, Take 1 tablet (25 mg total) by mouth daily., Disp: 90 tablet, Rfl: 3 .  lansoprazole (PREVACID) 30 MG capsule, TAKE 1 CAPSULE (30 MG TOTAL) BY MOUTH DAILY., Disp: 90 capsule, Rfl: 3 .  loratadine (CLARITIN) 10 MG tablet, Take by mouth., Disp: , Rfl:  .  meloxicam (MOBIC) 15 MG tablet, Take 1 tablet (15 mg total) by mouth daily. To start  the day after completion of steroid pack., Disp: 30 tablet, Rfl: 0 .  methylPREDNISolone (MEDROL DOSEPAK) 4 MG TBPK tablet, 6 Day Taper Pack. Take as Directed., Disp: 21 tablet, Rfl: 0 .  naproxen (NAPROSYN) 500 MG tablet, , Disp: , Rfl: 1 .  sertraline (ZOLOFT) 50 MG tablet, Take 1 tablet (50 mg total) by mouth daily., Disp: 90 tablet, Rfl: 3 .  VENTOLIN HFA 108 (90 Base) MCG/ACT inhaler, INHALE 2 PUFFS INTO THE LUNGS EVERY 4 (FOUR) HOURS AS NEEDED., Disp: 54 g, Rfl: 0  Allergies  Allergen Reactions  . Escitalopram Oxalate     REACTION: sedation  . Prozac [Fluoxetine Hcl]     Wt gain    Review of Systems: Negative except as noted in the HPI. Denies N/V/F/Ch. Objective:  There were no vitals filed for this visit. General AA&O x3. Normal mood and affect.  Vascular Dorsalis pedis and posterior tibial pulses  present 2+ bilaterally  Capillary refill normal to all digits. Pedal hair growth normal.  Neurologic Epicritic sensation grossly present.  Dermatologic No open lesions. Interspaces clear of maceration. Nails well groomed and normal in appearance.  Orthopedic: MMT 5/5 in dorsiflexion, plantarflexion, inversion, and eversion. Normal joint ROM without pain or crepitus. Local warmth left first MPJ with pain on range of motion.   Assessment &  Plan:  Patient was evaluated and treated and all questions answered.  Gout, hallux limitus left foot -She was taken reviewed no acute fracture dislocation early arthritic changes first MPJ first met primus elevatus noted -Injection delivered left first MPJ as below -Rx for Medrol meloxicam only to take meloxicam after completion of Medrol Pak   Procedure: Joint Injection Location: Left 1st MPJ joint Skin Prep: Alcohol. Injectate: 0.5 cc 1% lidocaine plain, 0.5 cc dexamethasone phosphate. Disposition: Patient tolerated procedure well. Injection site dressed with a band-aid.  Return in about 2 weeks (around 06/10/2018) for Gout F/u.

## 2018-05-30 ENCOUNTER — Ambulatory Visit (INDEPENDENT_AMBULATORY_CARE_PROVIDER_SITE_OTHER): Payer: No Typology Code available for payment source | Admitting: Family Medicine

## 2018-05-30 ENCOUNTER — Encounter: Payer: Self-pay | Admitting: Family Medicine

## 2018-05-30 VITALS — BP 132/90 | HR 60 | Temp 98.6°F | Ht 72.0 in | Wt 295.2 lb

## 2018-05-30 DIAGNOSIS — Z0000 Encounter for general adult medical examination without abnormal findings: Secondary | ICD-10-CM | POA: Diagnosis not present

## 2018-05-30 DIAGNOSIS — M109 Gout, unspecified: Secondary | ICD-10-CM

## 2018-05-30 DIAGNOSIS — E786 Lipoprotein deficiency: Secondary | ICD-10-CM

## 2018-05-30 DIAGNOSIS — R739 Hyperglycemia, unspecified: Secondary | ICD-10-CM

## 2018-05-30 DIAGNOSIS — F3289 Other specified depressive episodes: Secondary | ICD-10-CM | POA: Diagnosis not present

## 2018-05-30 DIAGNOSIS — I10 Essential (primary) hypertension: Secondary | ICD-10-CM

## 2018-05-30 MED ORDER — FLUTICASONE PROPIONATE 50 MCG/ACT NA SUSP: 2.0000 | Freq: Every day | NASAL | 3 refills | Status: DC

## 2018-05-30 MED ORDER — COLCHICINE 0.6 MG PO TABS: 0.6000 mg | ORAL_TABLET | Freq: Two times a day (BID) | ORAL | 3 refills | Status: AC | PRN

## 2018-05-30 MED ORDER — HYDROCHLOROTHIAZIDE 25 MG PO TABS: 25.0000 mg | ORAL_TABLET | Freq: Every day | ORAL | 3 refills | Status: DC

## 2018-05-30 MED ORDER — LANSOPRAZOLE 30 MG PO CPDR: DELAYED_RELEASE_CAPSULE | ORAL | 3 refills | Status: DC

## 2018-05-30 NOTE — Progress Notes (Signed)
Subjective:    Patient ID: Samuel Murray, male    DOB: 10/12/1976, 42 y.o.   MRN: [id number]  HPI Here for health maintenance exam and to review chronic medical problems    Doing well   Wt Readings from Last 3 Encounters:  05/30/18 295 lb 4 oz (133.9 kg)  05/12/17 (!) 306 lb 4 oz (138.9 kg)  11/09/16 (!) 309 lb 4 oz (140.3 kg)  has lost 30 lb in 4 weeks  Using nutra system (to start less than 1000 cal per day)-now it is 1500  Now used to it and the food is good  It is an investment- but worth it  A cheat meal per week  Portion size was the trick all along  40.04 kg/m   Has not been able to exercise  Having trouble with arthritis -- high impact activity bothers him /also a little gout  Cortisone shots  Has been on small bursts of prednisone (currently on a taper)  Also gout- with high uric acid  Father has gout  Lab Results  Component Value Date   LABURIC 7.6 03/13/2015   he is interested in allopurinol  Takes colchicine prn   Thinks he could do elliptical /can perhaps bike No where to swim    Flu shot 10/18 Tetanus shot 3/11  Prostate health --no problems  No nocturia  No fam hx of prostate cancer   bp is up a bit due to prednisone  No cp or palpitations or headaches or edema  No side effects to medicines  BP Readings from Last 3 Encounters:  05/30/18 132/90  03/02/18 128/86  10/06/17 138/88      Hyperglycemia  Lab Results  Component Value Date   HGBA1C 6.0 05/10/2017  due for a re check   Also for cholesterol Lab Results  Component Value Date   CHOL 159 05/10/2017   HDL 35.30 (L) 05/10/2017   LDLCALC 101 (H) 05/10/2017   LDLDIRECT 132.0 02/11/2010   TRIG 114.0 05/10/2017   CHOLHDL 5 05/10/2017  eating better-expect improvement   Patient Active Problem List   Diagnosis Date Noted  . Foot pain, bilateral 05/19/2018  . Lateral epicondylitis 03/02/2018  . DDD (degenerative disc disease), lumbar 05/12/2017  . Lump of right thigh 11/09/2016    . Hyperglycemia 04/22/2016  . Low HDL (under 40) 04/22/2016  . Routine general medical examination at a health care facility 12/06/2015  . Depression 03/13/2015  . Essential hypertension 03/13/2015  . Gout 03/06/2015  . GERD (gastroesophageal reflux disease) 02/05/2013  . Morbid obesity (HCC) 03/01/2008  . ADD 02/01/2008  . ALLERGIC RHINITIS 02/01/2008  . ASTHMA 02/01/2008   Past Medical History:  Diagnosis Date  . Allergic rhinitis   . Asthma   . Attention deficit disorder 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   . Depression   . Elevated blood pressure reading without diagnosis of hypertension   . Obesity, unspecified    Past Surgical History:  Procedure Laterality Date  . ANKLE SURGERY    . TONSILLECTOMY AND ADENOIDECTOMY     Social History   Tobacco Use  . Smoking status: Former Smoker    Last attempt to quit: 12/28/2000    Years since quitting: 17.4  . Smokeless tobacco: Never Used  Substance Use Topics  . Alcohol use: No    Alcohol/week: 0.0 oz  . Drug use: No   Family History  Problem Relation Age of Onset  . Hypertension Father   . Other Father  heart problems  . Heart disease Father   . Hypothyroidism Sister   . Thyroid disease Sister        hypothyroid   Allergies  Allergen Reactions  . Escitalopram Oxalate     REACTION: sedation  . Prozac [Fluoxetine Hcl]     Wt gain    Current Outpatient Medications on File Prior to Visit  Medication Sig Dispense Refill  . amoxicillin (AMOXIL) 500 MG capsule   0  . betamethasone dipropionate (DIPROLENE) 0.05 % cream Apply topically 2 (two) times daily. For maximum 10 days. 45 g 0  . loratadine (CLARITIN) 10 MG tablet Take by mouth.    . meloxicam (MOBIC) 15 MG tablet Take 1 tablet (15 mg total) by mouth daily. To start the day after completion of steroid pack. 30 tablet 0  . methylPREDNISolone (MEDROL DOSEPAK) 4 MG TBPK tablet 6 Day Taper Pack. Take as Directed. 21 tablet 0  . naproxen (NAPROSYN) 500 MG tablet    1  . PREDNISONE PO Take by mouth. Taper    . sertraline (ZOLOFT) 50 MG tablet Take 1 tablet (50 mg total) by mouth daily. 90 tablet 3  . VENTOLIN HFA 108 (90 Base) MCG/ACT inhaler INHALE 2 PUFFS INTO THE LUNGS EVERY 4 (FOUR) HOURS AS NEEDED. 54 g 0   No current facility-administered medications on file prior to visit.     Review of Systems  Constitutional: Negative for activity change, appetite change, fatigue, fever and unexpected weight change.  HENT: Negative for congestion, rhinorrhea, sore throat and trouble swallowing.   Eyes: Negative for pain, redness, itching and visual disturbance.  Respiratory: Negative for cough, chest tightness, shortness of breath and wheezing.   Cardiovascular: Negative for chest pain and palpitations.  Gastrointestinal: Negative for abdominal pain, blood in stool, constipation, diarrhea and nausea.  Endocrine: Negative for cold intolerance, heat intolerance, polydipsia and polyuria.  Genitourinary: Negative for difficulty urinating, dysuria, frequency and urgency.  Musculoskeletal: Positive for arthralgias. Negative for joint swelling and myalgias.       Foot pain   Skin: Negative for pallor and rash.  Neurological: Negative for dizziness, tremors, weakness, numbness and headaches.  Hematological: Negative for adenopathy. Does not bruise/bleed easily.  Psychiatric/Behavioral: Negative for decreased concentration and dysphoric mood. The patient is not nervous/anxious.        Objective:   Physical Exam  Constitutional: He appears well-developed and well-nourished. No distress.  obese and well appearing   HENT:  Head: Normocephalic and atraumatic.  Right Ear: External ear normal.  Left Ear: External ear normal.  Nose: Nose normal.  Mouth/Throat: Oropharynx is clear and moist.  Eyes: Pupils are equal, round, and reactive to light. Conjunctivae and EOM are normal. Right eye exhibits no discharge. Left eye exhibits no discharge. No scleral icterus.   Neck: Normal range of motion. Neck supple. No JVD present. Carotid bruit is not present. No thyromegaly present.  Cardiovascular: Normal rate, regular rhythm, normal heart sounds and intact distal pulses. Exam reveals no gallop.  Pulmonary/Chest: Effort normal and breath sounds normal. No respiratory distress. He has no wheezes. He exhibits no tenderness.  No crackles or wheeze  Abdominal: Soft. Bowel sounds are normal. He exhibits no distension, no abdominal bruit and no mass. There is no tenderness.  Musculoskeletal: He exhibits no edema or tenderness.  Lymphadenopathy:    He has no cervical adenopathy.  Neurological: He is alert. He has normal reflexes. He displays normal reflexes. No cranial nerve deficit. He exhibits normal muscle tone. Coordination  normal.  Skin: Skin is warm and dry. No rash noted. No erythema. No pallor.  Solar lentigines diffusely  tanned  Psychiatric: He has a normal mood and affect.  Pleasant Good mood          Assessment & Plan:   Problem List Items Addressed This Visit      Cardiovascular and Mediastinum   Essential hypertension    bp in fair control at this time (elevated more than expected due to current prednisone for toe pain) BP Readings from Last 1 Encounters:  05/30/18 132/90   No changes needed-enc further wt loss  Most recent labs reviewed  Disc lifstyle change with low sodium diet and exercise        Relevant Medications   hydrochlorothiazide (HYDRODIURIL) 25 MG tablet   Other Relevant Orders   CBC with Differential/Platelet   Comprehensive metabolic panel   Lipid panel   TSH     Other   Depression    Continue sertraline which continues to work well       Gout    Uric acid level today  Takes colchicine 0.6 mg prn  Interested in allopurinol for prevention  Will see how labs look first  Disc low purine diet       Relevant Orders   Uric acid   Hyperglycemia    A1C today  Hope for imp with 30 lb wt loss (but on  prednisone currently)  Enc further wt loss  Disc goals for exercise  disc imp of low glycemic diet and wt loss to prevent DM2       Relevant Orders   Hemoglobin A1c   Low HDL (under 40)    Lipid panel today  Good diet but not exercising lately  HDL may be down      Morbid obesity (HCC)    Discussed how this problem influences overall health and the risks it imposes  Reviewed plan for weight loss with lower calorie diet (via better food choices and also portion control or program like weight watchers) and exercise building up to or more than 30 minutes 5 days per week including some aerobic activity   Commended on great work with nutra system so far Enc to start low impact exercise       Routine general medical examination at a health care facility - Primary    Reviewed health habits including diet and exercise and skin cancer prevention Reviewed appropriate screening tests for age  Also reviewed health mt list, fam hx and immunization status , as well as social and family history   See HPI Labs ordered for wellness Disc low impact exercise in light of foot problems Commended on large wt loss so far  Wellness labs may be affected by prednisone

## 2018-05-30 NOTE — Assessment & Plan Note (Signed)
Discussed how this problem influences overall health and the risks it imposes  Reviewed plan for weight loss with lower calorie diet (via better food choices and also portion control or program like weight watchers) and exercise building up to or more than 30 minutes 5 days per week including some aerobic activity   Commended on great work with nutra system so far Enc to start low impact exercise

## 2018-05-30 NOTE — Assessment & Plan Note (Signed)
Lipid panel today  Good diet but not exercising lately  HDL may be down

## 2018-05-30 NOTE — Patient Instructions (Addendum)
Try elliptical/ bike for exercise  Also swimming is a good option   [place] - keep it up !   Labs today  If uric acid is high we can consider the allopurinol

## 2018-05-30 NOTE — Assessment & Plan Note (Signed)
Uric acid level today  Takes colchicine 0.6 mg prn  Interested in allopurinol for prevention  Will see how labs look first  Disc low purine diet

## 2018-05-30 NOTE — Assessment & Plan Note (Signed)
A1C today  Hope for imp with 30 lb wt loss (but on prednisone currently)  Enc further wt loss  Disc goals for exercise  disc imp of low glycemic diet and wt loss to prevent DM2

## 2018-05-30 NOTE — Assessment & Plan Note (Signed)
Continue sertraline which continues to work well

## 2018-05-30 NOTE — Assessment & Plan Note (Signed)
bp in fair control at this time (elevated more than expected due to current prednisone for toe pain) BP Readings from Last 1 Encounters:  05/30/18 132/90   No changes needed-enc further wt loss  Most recent labs reviewed  Disc lifstyle change with low sodium diet and exercise

## 2018-05-30 NOTE — Assessment & Plan Note (Signed)
Reviewed health habits including diet and exercise and skin cancer prevention Reviewed appropriate screening tests for age  Also reviewed health mt list, fam hx and immunization status , as well as social and family history   See HPI Labs ordered for wellness Disc low impact exercise in light of foot problems Commended on large wt loss so far  Wellness labs may be affected by prednisone

## 2018-05-31 LAB — CBC WITH DIFFERENTIAL/PLATELET
BASOS ABS: 0.1 10*3/uL (ref 0.0–0.1)
Basophils Relative: 0.8 % (ref 0.0–3.0)
EOS ABS: 0.1 10*3/uL (ref 0.0–0.7)
EOS PCT: 0.9 % (ref 0.0–5.0)
HCT: 45.8 % (ref 39.0–52.0)
HEMOGLOBIN: 15.8 g/dL (ref 13.0–17.0)
LYMPHS ABS: 2.4 10*3/uL (ref 0.7–4.0)
Lymphocytes Relative: 26.5 % (ref 12.0–46.0)
MCHC: 34.5 g/dL (ref 30.0–36.0)
MCV: 87 fl (ref 78.0–100.0)
MONO ABS: 0.5 10*3/uL (ref 0.1–1.0)
Monocytes Relative: 5.6 % (ref 3.0–12.0)
NEUTROS PCT: 66.2 % (ref 43.0–77.0)
Neutro Abs: 6 10*3/uL (ref 1.4–7.7)
Platelets: 226 10*3/uL (ref 150.0–400.0)
RBC: 5.26 Mil/uL (ref 4.22–5.81)
RDW: 13.4 % (ref 11.5–15.5)
WBC: 9.1 10*3/uL (ref 4.0–10.5)

## 2018-05-31 LAB — COMPREHENSIVE METABOLIC PANEL
ALBUMIN: 4.9 g/dL (ref 3.5–5.2)
ALT: 41 U/L (ref 0–53)
AST: 28 U/L (ref 0–37)
Alkaline Phosphatase: 74 U/L (ref 39–117)
BUN: 26 mg/dL — AB (ref 6–23)
CO2: 30 mEq/L (ref 19–32)
CREATININE: 1.07 mg/dL (ref 0.40–1.50)
Calcium: 9.7 mg/dL (ref 8.4–10.5)
Chloride: 103 mEq/L (ref 96–112)
GFR: 79.83 mL/min (ref >60.00)
GLUCOSE: 98 mg/dL (ref 70–99)
Potassium: 4.7 mEq/L (ref 3.5–5.1)
Sodium: 143 mEq/L (ref 135–145)
TOTAL PROTEIN: 7.6 g/dL (ref 6.0–8.3)
Total Bilirubin: 0.5 mg/dL (ref 0.2–1.2)

## 2018-05-31 LAB — HEMOGLOBIN A1C: Hgb A1c MFr Bld: 5.8 % (ref 4.6–6.5)

## 2018-05-31 LAB — LIPID PANEL
Cholesterol: 178 mg/dL (ref 0–200)
HDL: 35.7 mg/dL — ABNORMAL LOW (ref >39.00)
LDL Cholesterol: 123 mg/dL — ABNORMAL HIGH (ref 0–99)
NONHDL: 141.84
Total CHOL/HDL Ratio: 5
Triglycerides: 92 mg/dL (ref 0.0–149.0)
VLDL: 18.4 mg/dL (ref 0.0–40.0)

## 2018-05-31 LAB — URIC ACID: URIC ACID, SERUM: 7.2 mg/dL (ref 4.0–7.8)

## 2018-05-31 LAB — TSH: TSH: 1.41 u[IU]/mL (ref 0.35–4.50)

## 2018-06-01 ENCOUNTER — Other Ambulatory Visit: Payer: Self-pay | Admitting: Family Medicine

## 2018-06-02 ENCOUNTER — Telehealth: Payer: Self-pay | Admitting: Family Medicine

## 2018-06-02 MED ORDER — ALLOPURINOL 100 MG PO TABS: 100.0000 mg | ORAL_TABLET | Freq: Every day | ORAL | 3 refills | Status: DC

## 2018-06-02 NOTE — Telephone Encounter (Signed)
I sent allopurinol 100 mg once daily   Please schedule non fasting labs for 4-6 weeks for renal panel, uric acid for gout  Thanks  We will titrate up if needed to control uric acid Alert me if side effects or problems

## 2018-06-03 NOTE — Telephone Encounter (Signed)
Pt notified Rx sent and advise pt of Dr. Royden Purlower's comments. Pt declined to schedule f/u lab appt because he has to get his schedule 1st but he will call back and make appt

## 2018-06-09 ENCOUNTER — Ambulatory Visit: Payer: No Typology Code available for payment source | Admitting: Podiatry

## 2018-06-16 ENCOUNTER — Encounter: Payer: Self-pay | Admitting: Podiatry

## 2018-06-16 ENCOUNTER — Ambulatory Visit: Payer: No Typology Code available for payment source | Admitting: Podiatry

## 2018-06-16 DIAGNOSIS — G5763 Lesion of plantar nerve, bilateral lower limbs: Secondary | ICD-10-CM | POA: Diagnosis not present

## 2018-06-16 DIAGNOSIS — M205X2 Other deformities of toe(s) (acquired), left foot: Secondary | ICD-10-CM | POA: Diagnosis not present

## 2018-06-16 DIAGNOSIS — M109 Gout, unspecified: Secondary | ICD-10-CM | POA: Diagnosis not present

## 2018-06-16 MED ORDER — MELOXICAM 15 MG PO TABS: 15.0000 mg | ORAL_TABLET | Freq: Every day | ORAL | 0 refills | Status: DC

## 2018-06-19 NOTE — Progress Notes (Signed)
Subjective:  Patient ID: Samuel Murray, male    DOB: 03/12/1976,  MRN: [id number]  Chief Complaint  Patient presents with  . Hallux limitus    left foot is "doing much better, can walk on it now"   42 y.o. male presents with the above complaint.  States his foot is doing much better he can walk and now however still having some general the pain in the forefoot of both feet.  Past Medical History:  Diagnosis Date  . Allergic rhinitis   . Asthma   . Attention deficit disorder 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   . Depression   . Elevated blood pressure reading without diagnosis of hypertension   . Obesity, unspecified    Past Surgical History:  Procedure Laterality Date  . ANKLE SURGERY    . TONSILLECTOMY AND ADENOIDECTOMY      Current Outpatient Medications:  .  allopurinol (ZYLOPRIM) 100 MG tablet, Take 1 tablet (100 mg total) by mouth daily., Disp: 90 tablet, Rfl: 3 .  amoxicillin (AMOXIL) 500 MG capsule, , Disp: , Rfl: 0 .  betamethasone dipropionate (DIPROLENE) 0.05 % cream, Apply topically 2 (two) times daily. For maximum 10 days., Disp: 45 g, Rfl: 0 .  colchicine 0.6 MG tablet, Take 1 tablet (0.6 mg total) by mouth 2 (two) times daily as needed (for gout flare)., Disp: 30 tablet, Rfl: 3 .  fluticasone (FLONASE) 50 MCG/ACT nasal spray, Place 2 sprays into both nostrils daily., Disp: 48 g, Rfl: 3 .  hydrochlorothiazide (HYDRODIURIL) 25 MG tablet, Take 1 tablet (25 mg total) by mouth daily., Disp: 90 tablet, Rfl: 3 .  lansoprazole (PREVACID) 30 MG capsule, TAKE 1 CAPSULE (30 MG TOTAL) BY MOUTH DAILY., Disp: 90 capsule, Rfl: 3 .  loratadine (CLARITIN) 10 MG tablet, Take by mouth., Disp: , Rfl:  .  meloxicam (MOBIC) 15 MG tablet, Take 1 tablet (15 mg total) by mouth daily. To start the day after completion of steroid pack., Disp: 30 tablet, Rfl: 0 .  methylPREDNISolone (MEDROL DOSEPAK) 4 MG TBPK tablet, 6 Day Taper Pack. Take as Directed., Disp: 21 tablet, Rfl: 0 .  naproxen  (NAPROSYN) 500 MG tablet, , Disp: , Rfl: 1 .  sertraline (ZOLOFT) 50 MG tablet, TAKE 1 TABLET BY MOUTH ONCE DAILY, Disp: 90 tablet, Rfl: 2 .  VENTOLIN HFA 108 (90 Base) MCG/ACT inhaler, INHALE 2 PUFFS INTO THE LUNGS EVERY 4 (FOUR) HOURS AS NEEDED., Disp: 54 g, Rfl: 0  Allergies  Allergen Reactions  . Escitalopram Oxalate     REACTION: sedation  . Prozac [Fluoxetine Hcl]     Wt gain    Review of Systems: Negative except as noted in the HPI. Denies N/V/F/Ch. Objective:  There were no vitals filed for this visit. General AA&O x3. Normal mood and affect.  Vascular Dorsalis pedis and posterior tibial pulses  present 2+ bilaterally  Capillary refill normal to all digits. Pedal hair growth normal.  Neurologic Epicritic sensation grossly present.  Dermatologic No open lesions. Interspaces clear of maceration. Nails well groomed and normal in appearance.  Orthopedic: MMT 5/5 in dorsiflexion, plantarflexion, inversion, and eversion. Normal joint ROM without pain or crepitus. Pain palpation about the third MPJ bilateral with positive Mulder's click   Assessment & Plan:  Patient was evaluated and treated and all questions answered.  Gout, hallux limitus left foot -No pain today.  Will benefit from orthotic modification.  To include reverse Morton's extension and neuropathic  Neuromas Bilat -Injection as below.  Procedure: Neuroma Injection  Location: Bilateral 3rd interspace Skin Prep: Alcohol. Injectate: 0.5 cc 0.5% marcaine plain, 0.5 cc dexamethasone phosphate. Disposition: Patient tolerated procedure well. Injection site dressed with a band-aid.  Return in about 6 weeks (around 07/28/2018) for Neuroma.

## 2018-07-28 ENCOUNTER — Other Ambulatory Visit (INDEPENDENT_AMBULATORY_CARE_PROVIDER_SITE_OTHER): Payer: No Typology Code available for payment source | Admitting: Orthotics

## 2018-08-17 ENCOUNTER — Ambulatory Visit (INDEPENDENT_AMBULATORY_CARE_PROVIDER_SITE_OTHER): Payer: No Typology Code available for payment source | Admitting: Orthotics

## 2018-08-17 DIAGNOSIS — G5763 Lesion of plantar nerve, bilateral lower limbs: Secondary | ICD-10-CM | POA: Diagnosis not present

## 2018-08-17 DIAGNOSIS — M205X2 Other deformities of toe(s) (acquired), left foot: Secondary | ICD-10-CM | POA: Diagnosis not present

## 2018-08-17 NOTE — Progress Notes (Signed)
Patient came in today to pick up custom made foot orthotics.  The goals were accomplished and the patient reported no dissatisfaction with said orthotics.  Patient was advised of breakin period and how to report any issues. 

## 2018-08-18 ENCOUNTER — Ambulatory Visit: Payer: No Typology Code available for payment source | Admitting: Podiatry

## 2018-08-18 ENCOUNTER — Other Ambulatory Visit: Payer: No Typology Code available for payment source | Admitting: Orthotics

## 2018-08-18 DIAGNOSIS — R238 Other skin changes: Secondary | ICD-10-CM | POA: Diagnosis not present

## 2018-08-18 DIAGNOSIS — L309 Dermatitis, unspecified: Secondary | ICD-10-CM | POA: Diagnosis not present

## 2018-08-18 DIAGNOSIS — M205X2 Other deformities of toe(s) (acquired), left foot: Secondary | ICD-10-CM

## 2018-08-18 MED ORDER — TRIAMCINOLONE ACETONIDE 0.1 % EX CREA: TOPICAL_CREAM | CUTANEOUS | 0 refills | Status: DC

## 2018-08-18 NOTE — Progress Notes (Signed)
Subjective:  Patient ID: Samuel Murray, male    DOB: 05/17/1976,  MRN: [id number]  Chief Complaint  Patient presents with  . Toe Pain    injection left hallux    42 y.o. male presents with the above complaint. Having pain in the left big toe. Requesting injection today. Received orthotics yesterday is in the process of working them in.  New complaint of rash to the left foot. Also having spotting areas on right foot and left arm. Has used diprolene cream without relief.  Review of Systems: Negative except as noted in the HPI. Denies N/V/F/Ch.  Past Medical History:  Diagnosis Date  . Allergic rhinitis   . Asthma   . Attention deficit disorder 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   . Depression   . Elevated blood pressure reading without diagnosis of hypertension   . Obesity, unspecified     Current Outpatient Medications:  .  allopurinol (ZYLOPRIM) 100 MG tablet, Take 1 tablet (100 mg total) by mouth daily., Disp: 90 tablet, Rfl: 3 .  amoxicillin (AMOXIL) 500 MG capsule, , Disp: , Rfl: 0 .  betamethasone dipropionate (DIPROLENE) 0.05 % cream, Apply topically 2 (two) times daily. For maximum 10 days., Disp: 45 g, Rfl: 0 .  colchicine 0.6 MG tablet, Take 1 tablet (0.6 mg total) by mouth 2 (two) times daily as needed (for gout flare)., Disp: 30 tablet, Rfl: 3 .  fluticasone (FLONASE) 50 MCG/ACT nasal spray, Place 2 sprays into both nostrils daily., Disp: 48 g, Rfl: 3 .  hydrochlorothiazide (HYDRODIURIL) 25 MG tablet, Take 1 tablet (25 mg total) by mouth daily., Disp: 90 tablet, Rfl: 3 .  lansoprazole (PREVACID) 30 MG capsule, TAKE 1 CAPSULE (30 MG TOTAL) BY MOUTH DAILY., Disp: 90 capsule, Rfl: 3 .  loratadine (CLARITIN) 10 MG tablet, Take by mouth., Disp: , Rfl:  .  meloxicam (MOBIC) 15 MG tablet, Take 1 tablet (15 mg total) by mouth daily. To start the day after completion of steroid pack., Disp: 30 tablet, Rfl: 0 .  methylPREDNISolone (MEDROL DOSEPAK) 4 MG TBPK tablet, 6 Day Taper  Pack. Take as Directed., Disp: 21 tablet, Rfl: 0 .  naproxen (NAPROSYN) 500 MG tablet, , Disp: , Rfl: 1 .  sertraline (ZOLOFT) 50 MG tablet, TAKE 1 TABLET BY MOUTH ONCE DAILY, Disp: 90 tablet, Rfl: 2 .  triamcinolone cream (KENALOG) 0.1 %, Apply pea sized amount twice daily to affected areas, Disp: 30 g, Rfl: 0 .  VENTOLIN HFA 108 (90 Base) MCG/ACT inhaler, INHALE 2 PUFFS INTO THE LUNGS EVERY 4 (FOUR) HOURS AS NEEDED., Disp: 54 g, Rfl: 0  Social History   Tobacco Use  Smoking Status Former Smoker  . Last attempt to quit: 12/28/2000  . Years since quitting: 17.6  Smokeless Tobacco Never Used    Allergies  Allergen Reactions  . Escitalopram Oxalate     REACTION: sedation  . Prozac [Fluoxetine Hcl]     Wt gain    Objective:  There were no vitals filed for this visit. There is no height or weight on file to calculate BMI. Constitutional Well developed. Well nourished.  Vascular Dorsalis pedis pulses palpable bilaterally. Posterior tibial pulses palpable bilaterally. Capillary refill normal to all digits.  No cyanosis or clubbing noted. Pedal hair growth normal.  Neurologic Normal speech. Oriented to person, place, and time. Epicritic sensation to light touch grossly present bilaterally.  Dermatologic Nails well groomed and normal in appearance. No open wounds. No skin lesions. Vesicular rash L forefoot, L arm.  Orthopedic: Normal joint ROM without pain or crepitus bilaterally except for pain on palpation 1st MPJ L. Pain and crepitus on ROM. No visible deformities.   Radiographs: None Assessment:   1. Hallux limitus of left foot   2. Vesicular rash   3. Dermatitis    Plan:  Patient was evaluated and treated and all questions answered.  Hallux Limitus Left -Injection as below.  Procedure: Joint Injection Location: Left 1st MPJ joint Skin Prep: Alcohol. Injectate: 0.5 cc 1% lidocaine plain, 0.5 cc dexamethasone phosphate. Disposition: Patient tolerated procedure  well. Injection site dressed with a band-aid.  Vesicular Rash/Dermatitis -Trial triamcinolone cream. -Should the rash worsen or not resolve recommend f/u with dermatology.  Return in about 3 weeks (around 09/08/2018) for Hallux limitus Left.

## 2018-09-08 ENCOUNTER — Ambulatory Visit: Payer: No Typology Code available for payment source | Admitting: Podiatry

## 2018-11-28 ENCOUNTER — Ambulatory Visit (INDEPENDENT_AMBULATORY_CARE_PROVIDER_SITE_OTHER): Payer: Self-pay | Admitting: Physician Assistant

## 2018-11-28 ENCOUNTER — Encounter: Payer: Self-pay | Admitting: Physician Assistant

## 2018-11-28 VITALS — BP 130/90 | HR 80 | Temp 98.6°F | Wt 310.0 lb

## 2018-11-28 DIAGNOSIS — H6592 Unspecified nonsuppurative otitis media, left ear: Secondary | ICD-10-CM

## 2018-11-28 MED ORDER — AMOXICILLIN-POT CLAVULANATE 875-125 MG PO TABS: 1.0000 | ORAL_TABLET | Freq: Two times a day (BID) | ORAL | 0 refills | Status: AC

## 2018-11-28 NOTE — Progress Notes (Signed)
Patient ID: Samuel Murray DOB: 1976/08/16 AGE: 42 y.o. MRN: [id number]   PCP: Judy Pimple, MD   Chief Complaint:  Chief Complaint  Patient presents with  . Focus- left ear issue/scratchy throat x3d     Subjective:    HPI:  Samuel Murray is a 42 y.o. male presents for evaluation  Chief Complaint  Patient presents with  . Focus- left ear issue/scratchy throat x35d    42 year old male presents to [facility] with 3 day history of left ear pain. Began on Saturday 11/26/2018 morning with sore/scratchy throat. Patient originally believed sore throat due to GERD exacerbation due to overeating over Thanksgiving holiday. Patient on Prevacid daily. Denies chest pain, eructation, nausea/vomiting, abdominal bloating/distention, early satiety - states he had acid taste in his throat day after Thanksgiving/Friday, has since resolved. Patient then developed post-nasal drip, left ear pain, and dry cough. Cough nagging. Minimal wheezing. Patient asthmatic. Has been using albuterol inhaler occasionally. Patient described left ear pain as pressure/fullness. States he has had discharge from left ear canal. Has picture of pale yellow discharge on Q-tip on phone. Has taken OTC Mucinex and Nyquil with minimal relief. Denies fever, chills, headache, tinnitus, sinus pain, nasal congestion, chest pain, chest congestion, shortness of breath. Patient denies use of ear plugs. Denies recent swimming. Patient as a child had ear tubes placed x 2.  A complete, at least 10 system review of symptoms was performed, pertinent positives and negatives as mentioned in HPI, otherwise negative.  The following portions of the patient's history were reviewed and updated as appropriate: allergies, current medications and past medical history.  Patient Active Problem List   Diagnosis Date Noted  . Foot pain, bilateral 05/19/2018  . Lateral epicondylitis 03/02/2018  . DDD (degenerative disc disease), lumbar  05/12/2017  . Lump of right thigh 11/09/2016  . Hyperglycemia 04/22/2016  . Low HDL (under 40) 04/22/2016  . Routine general medical examination at a health care facility 12/06/2015  . Depression 03/13/2015  . Essential hypertension 03/13/2015  . Gout 03/06/2015  . GERD (gastroesophageal reflux disease) 02/05/2013  . Morbid obesity (HCC) 03/01/2008  . ADD 02/01/2008  . ALLERGIC RHINITIS 02/01/2008  . ASTHMA 02/01/2008    Allergies  Allergen Reactions  . Escitalopram Oxalate     REACTION: sedation  . Prozac [Fluoxetine Hcl]     Wt gain     Current Outpatient Medications on File Prior to Visit  Medication Sig Dispense Refill  . allopurinol (ZYLOPRIM) 100 MG tablet Take 1 tablet (100 mg total) by mouth daily. 90 tablet 3  . colchicine 0.6 MG tablet Take 1 tablet (0.6 mg total) by mouth 2 (two) times daily as needed (for gout flare). 30 tablet 3  . fluticasone (FLONASE) 50 MCG/ACT nasal spray Place 2 sprays into both nostrils daily. 48 g 3  . hydrochlorothiazide (HYDRODIURIL) 25 MG tablet Take 1 tablet (25 mg total) by mouth daily. 90 tablet 3  . lansoprazole (PREVACID) 30 MG capsule TAKE 1 CAPSULE (30 MG TOTAL) BY MOUTH DAILY. 90 capsule 3  . naproxen (NAPROSYN) 500 MG tablet   1  . sertraline (ZOLOFT) 50 MG tablet TAKE 1 TABLET BY MOUTH ONCE DAILY 90 tablet 2  . VENTOLIN HFA 108 (90 Base) MCG/ACT inhaler INHALE 2 PUFFS INTO THE LUNGS EVERY 4 (FOUR) HOURS AS NEEDED. 54 g 0   No current facility-administered medications on file prior to visit.        Objective:   Vitals:  11/28/18 0855  BP: 130/90  Pulse: 80  Temp: 98.6 F (37 C)  SpO2: 98%     Wt Readings from Last 3 Encounters:  11/28/18 (!) 310 lb (140.6 kg)  05/30/18 295 lb 4 oz (133.9 kg)  05/12/17 (!) 306 lb 4 oz (138.9 kg)    Physical Exam:   General Appearance:  Alert, cooperative, appears stated age. In no acute distress. Afebrile.  Head:  Normocephalic, without obvious abnormality, atraumatic  Eyes:   PERRL, conjunctiva/corneas clear, EOM's intact, fundi benign, both eyes  Ears:  Right ear canal clear. No cerumen. No edema, erythema, or discharge. Right TM shiny, pearly, no effusion. No erythema or injection. Scar tissue. Left ear canal with pale yellow soft discharge; possible cerumen. Mild tenderness with palpation over left tragus and with manipulation of left auricle. No pain with palpation over left mastoid. Left TM with diffuse erythema and bulging. Violaceous erythema. No visible aperture/rupture of TM.  Nose: Nares normal, septum midline. No discharge. Normal mucosa. No sinus tenderness with percussion/palpation.  Throat: Lips, mucosa, and tongue normal; teeth and gums normal. Throat reveals no erythema.  Neck: Supple, symmetrical, trachea midline, no lymphadenopathy  Lungs:   Clear to auscultation bilaterally, respirations unlabored. Occasional shallow dry cough during examination. Good aeration. No wheezing.  Heart:  Regular rate and rhythm, S1 and S2 normal, no murmur, rub, or gallop  Extremities: Extremities normal, atraumatic, no cyanosis or edema  Pulses: 2+ and symmetric  Skin: Skin color, texture, turgor normal, no rashes or lesions  Lymph nodes: Cervical, supraclavicular, and axillary nodes normal  Neurologic: Normal    Assessment & Plan:    Exam findings, diagnosis etiology and medication use and indications reviewed with patient. Follow-Up and discharge instructions provided. No emergent/urgent issues found on exam.  Patient education was provided.   Patient verbalized understanding of information provided and agrees with plan of care (POC), all questions answered. The patient is advised to call or return to clinic if condition does not see an improvement in symptoms, or to seek the care of the closest emergency department if condition worsens with the below plan.    1. Left otitis media with effusion  - amoxicillin-clavulanate (AUGMENTIN) 875-125 MG tablet; Take 1  tablet by mouth 2 (two) times daily for 7 days.  Dispense: 14 tablet; Refill: 0  Patient with 3-day history of left ear pressure.  On physical exam, left TM reveals significant erythema and bulging.  Diagnosed with left otitis media.  Prescribed Augmentin.  Advised patient take Augmentin with food and eat yogurt while on antibiotic.  Advised patient use an over the counter decongestant for fluid/pressure.  Ad[facility], follow-up with PCP, or be seen at urgent care if no symptom improvement in 2 days.   Janalyn HarderSamantha Aeriel Boulay, MHS, PA-C Rulon SeraSamantha F. Embry Huss, MHS, PA-C Advanced Practice Provider [facility]  [facility]  [address], [facility], [address]st Floor [place], [place] [address] (p):  [phone] [email or web address]

## 2018-11-28 NOTE — Patient Instructions (Signed)
Thank you for choosing [facility] for your health care needs today.  You have been diagnosed with left otitis media (left ear infection).  Take antibiotic, Augmentin. 1 pill twice a day x 1 week. Take with food to prevent stomach upset. Eat yogurt the week you are on the antibiotic.  Take an over the counter cough and cold medication such as Dayquil or Mucinex-D or Claritin-D.  [facility] or with family physician or at urgent care in 2 days if symptoms not improving.  Otitis Media, Adult Otitis media is redness, soreness, and puffiness (swelling) in the space just behind your eardrum (middle ear). It may be caused by allergies or infection. It often happens along with a cold. Follow these instructions at home:  Take your medicine as told. Finish it even if you start to feel better.  Only take over-the-counter or prescription medicines for pain, discomfort, or fever as told by your doctor.  Follow up with your doctor as told. Contact a doctor if:  You have otitis media only in one ear, or bleeding from your nose, or both.  You notice a lump on your neck.  You are not getting better in 3-5 days.  You feel worse instead of better. Get help right away if:  You have pain that is not helped with medicine.  You have puffiness, redness, or pain around your ear.  You get a stiff neck.  You cannot move part of your face (paralysis).  You notice that the bone behind your ear hurts when you touch it. This information is not intended to replace advice given to you by your health care provider. Make sure you discuss any questions you have with your health care provider. Document Released: 06/01/2008 Document Revised: 05/21/2016 Document Reviewed: 07/11/2013 Elsevier Interactive Patient Education  2017 [employer]

## 2018-12-01 ENCOUNTER — Telehealth: Payer: Self-pay | Admitting: Emergency Medicine

## 2018-12-01 NOTE — Telephone Encounter (Signed)
Left message follow up call from visit with [facility]. 

## 2018-12-16 ENCOUNTER — Ambulatory Visit: Payer: No Typology Code available for payment source | Admitting: Podiatry

## 2019-01-25 DIAGNOSIS — H9313 Tinnitus, bilateral: Secondary | ICD-10-CM | POA: Diagnosis not present

## 2019-01-25 DIAGNOSIS — H6980 Other specified disorders of Eustachian tube, unspecified ear: Secondary | ICD-10-CM | POA: Diagnosis not present

## 2019-03-14 ENCOUNTER — Other Ambulatory Visit: Payer: No Typology Code available for payment source | Admitting: Orthotics

## 2019-03-15 ENCOUNTER — Other Ambulatory Visit: Payer: Self-pay

## 2019-03-15 ENCOUNTER — Ambulatory Visit: Payer: No Typology Code available for payment source | Admitting: Orthotics

## 2019-03-15 DIAGNOSIS — G5763 Lesion of plantar nerve, bilateral lower limbs: Secondary | ICD-10-CM

## 2019-03-15 DIAGNOSIS — M205X2 Other deformities of toe(s) (acquired), left foot: Secondary | ICD-10-CM

## 2019-03-15 DIAGNOSIS — L309 Dermatitis, unspecified: Secondary | ICD-10-CM

## 2019-03-15 NOTE — Progress Notes (Signed)
Trimmed down reverse morton's to fit better in shoes.

## 2019-03-21 ENCOUNTER — Telehealth: Payer: Self-pay | Admitting: Podiatry

## 2019-03-21 DIAGNOSIS — G5763 Lesion of plantar nerve, bilateral lower limbs: Secondary | ICD-10-CM | POA: Diagnosis not present

## 2019-03-21 DIAGNOSIS — M205X2 Other deformities of toe(s) (acquired), left foot: Secondary | ICD-10-CM | POA: Diagnosis not present

## 2019-03-21 NOTE — Telephone Encounter (Signed)
Pt aware of the cost and coverage information

## 2019-03-21 NOTE — Telephone Encounter (Signed)
Pt called and  Left a message wanting to see about another pair of orthotics.  Returned call and pt has changed insurances and would like to order a second pair of orthotics and would like insurance to be billed.

## 2019-03-27 ENCOUNTER — Other Ambulatory Visit: Payer: Self-pay | Admitting: Family Medicine

## 2019-05-17 ENCOUNTER — Other Ambulatory Visit: Payer: Self-pay

## 2019-05-17 ENCOUNTER — Ambulatory Visit: Payer: 59 | Admitting: Orthotics

## 2019-05-17 DIAGNOSIS — M205X2 Other deformities of toe(s) (acquired), left foot: Secondary | ICD-10-CM

## 2019-05-17 DIAGNOSIS — G5763 Lesion of plantar nerve, bilateral lower limbs: Secondary | ICD-10-CM

## 2019-05-17 DIAGNOSIS — L309 Dermatitis, unspecified: Secondary | ICD-10-CM

## 2019-05-17 NOTE — Progress Notes (Signed)
Skived down reverse morton's on second pair.

## 2019-06-28 ENCOUNTER — Other Ambulatory Visit: Payer: Self-pay | Admitting: Family Medicine

## 2019-06-29 NOTE — Telephone Encounter (Signed)
Pt hasn't been seen in over a year and no future appts., please advise  

## 2019-06-29 NOTE — Telephone Encounter (Signed)
meds refilled once and Morey [facility] will reach out to try and get appt scheduled

## 2019-06-29 NOTE — Telephone Encounter (Signed)
Please schedule PE and refill until then  

## 2019-06-29 NOTE — Telephone Encounter (Signed)
I left a detailed message on patient's voice mail to call back and schedule cpx. °

## 2019-08-01 ENCOUNTER — Ambulatory Visit (INDEPENDENT_AMBULATORY_CARE_PROVIDER_SITE_OTHER): Payer: 59 | Admitting: Family Medicine

## 2019-08-01 ENCOUNTER — Other Ambulatory Visit: Payer: Self-pay

## 2019-08-01 ENCOUNTER — Encounter: Payer: Self-pay | Admitting: Family Medicine

## 2019-08-01 DIAGNOSIS — H6982 Other specified disorders of Eustachian tube, left ear: Secondary | ICD-10-CM

## 2019-08-01 MED ORDER — FLUTICASONE PROPIONATE 50 MCG/ACT NA SUSP: 2.0000 | Freq: Every day | NASAL | 6 refills | Status: DC

## 2019-08-01 NOTE — Patient Instructions (Addendum)
Use flonase 2 sprays in each nostril twice daily for 3 days and then once daily through the allergy season If no improvement you may need prednisone or ENT visit Let us know   Watch for increased pain or fever that could indicate ear infection   Keep [place] posted

## 2019-08-01 NOTE — Assessment & Plan Note (Signed)
L recurrent  Has seen ENT and had multiple sets of tubes in the past Today- relatively mild tx with flonas ns bid for 3 d then daily  If no imp may need prednisone Will watch for s/s of bacterial infection  Update if not starting to improve in a week or if worsening

## 2019-08-01 NOTE — Progress Notes (Signed)
Subjective:    Patient ID: Samuel Murray, male    DOB: [birth year], 43 y.o.   MRN: [id number]  HPI Here for L sided ear pain   Sees Dr Pryor Ochoa  Has h/o recurrent ETD  Unsure if he gets bacterial infections   Also prone to swimmers ear   Unsure which this is   Pressure  Drainage- colored material No blood   Pain is not severe= comes and goes  Cannot hear well   BPZ:WCHENIDP prn/ helps   Patient Active Problem List   Diagnosis Date Noted  . ETD (Eustachian tube dysfunction), left 08/01/2019  . Foot pain, bilateral 05/19/2018  . Lateral epicondylitis 03/02/2018  . DDD (degenerative disc disease), lumbar 05/12/2017  . Lump of right thigh 11/09/2016  . Hyperglycemia 04/22/2016  . Low HDL (under 40) 04/22/2016  . Routine general medical examination at a health care facility 12/06/2015  . Depression 03/13/2015  . Essential hypertension 03/13/2015  . Gout 03/06/2015  . GERD (gastroesophageal reflux disease) 02/05/2013  . Morbid obesity ([facility]) 03/01/2008  . ADD 02/01/2008  . ALLERGIC RHINITIS 02/01/2008  . ASTHMA 02/01/2008   Past Medical History:  Diagnosis Date  . Allergic rhinitis   . Asthma   . Attention deficit disorder 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   . Depression   . Elevated blood pressure reading without diagnosis of hypertension   . Obesity, unspecified    Past Surgical History:  Procedure Laterality Date  . ANKLE SURGERY    . TONSILLECTOMY AND ADENOIDECTOMY     Social History   Tobacco Use  . Smoking status: Former Smoker    Quit date: 12/28/2000    Years since quitting: 18.6  . Smokeless tobacco: Never Used  Substance Use Topics  . Alcohol use: No    Alcohol/week: 0.0 standard drinks  . Drug use: No   Family History  Problem Relation Age of Onset  . Hypertension Father   . Other Father        heart problems  . Heart disease Father   . Hypothyroidism Sister   . Thyroid disease Sister        hypothyroid   Allergies  Allergen Reactions   . Escitalopram Oxalate     REACTION: sedation  . Prozac [Fluoxetine Hcl]     Wt gain    Current Outpatient Medications on File Prior to Visit  Medication Sig Dispense Refill  . allopurinol (ZYLOPRIM) 100 MG tablet TAKE 1 TABLET (100 MG TOTAL) BY MOUTH DAILY. 90 tablet 0  . colchicine 0.6 MG tablet Take 1 tablet (0.6 mg total) by mouth 2 (two) times daily as needed (for gout flare). 30 tablet 3  . fluticasone (FLONASE) 50 MCG/ACT nasal spray Place 2 sprays into both nostrils daily. 48 g 3  . hydrochlorothiazide (HYDRODIURIL) 25 MG tablet TAKE 1 TABLET (25 MG TOTAL) BY MOUTH DAILY. 90 tablet 0  . lansoprazole (PREVACID) 30 MG capsule TAKE 1 CAPSULE (30 MG TOTAL) BY MOUTH DAILY. 90 capsule 0  . naproxen (NAPROSYN) 500 MG tablet   1  . sertraline (ZOLOFT) 50 MG tablet TAKE 1 TABLET BY MOUTH ONCE DAILY 90 tablet 0  . VENTOLIN HFA 108 (90 Base) MCG/ACT inhaler INHALE 2 PUFFS INTO THE LUNGS EVERY 4 (FOUR) HOURS AS NEEDED. 54 g 0   No current facility-administered medications on file prior to visit.     Review of Systems  Constitutional: Negative for activity change, appetite change, fatigue, fever and unexpected weight change.  HENT: Positive  for ear discharge, ear pain and postnasal drip. Negative for congestion, rhinorrhea, sinus pressure, sinus pain, sore throat and trouble swallowing.   Eyes: Negative for pain, redness, itching and visual disturbance.  Respiratory: Negative for cough, chest tightness, shortness of breath and wheezing.   Cardiovascular: Negative for chest pain and palpitations.  Gastrointestinal: Negative for abdominal pain, blood in stool, constipation, diarrhea and nausea.  Endocrine: Negative for cold intolerance, heat intolerance, polydipsia and polyuria.  Genitourinary: Negative for difficulty urinating, dysuria, frequency and urgency.  Musculoskeletal: Negative for arthralgias, joint swelling and myalgias.  Skin: Negative for pallor and rash.  Neurological: Negative  for dizziness, tremors, weakness, numbness and headaches.  Hematological: Negative for adenopathy. Does not bruise/bleed easily.  Psychiatric/Behavioral: Negative for decreased concentration and dysphoric mood. The patient is not nervous/anxious.        Objective:   Physical Exam Constitutional:      Appearance: Normal appearance. He is obese. He is not ill-appearing.  HENT:     Right Ear: Tympanic membrane and ear canal normal. There is no impacted cerumen.     Left Ear: Ear canal and external ear normal.     Ears:     Comments: L TM is dull with scarring and small effusion  No erythema  No opening in the TM No external tenderness and canal is clear     Nose: No rhinorrhea.     Mouth/Throat:     Mouth: Mucous membranes are moist.  Eyes:     General: No scleral icterus.       Right eye: No discharge.     Extraocular Movements: Extraocular movements intact.     Conjunctiva/sclera: Conjunctivae normal.     Pupils: Pupils are equal, round, and reactive to light.  Neck:     Musculoskeletal: Normal range of motion and neck supple.  Cardiovascular:     Rate and Rhythm: Normal rate and regular rhythm.  Pulmonary:     Effort: Pulmonary effort is normal. No respiratory distress.     Breath sounds: Normal breath sounds. No wheezing or rales.     Comments: Good air exch Lymphadenopathy:     Cervical: No cervical adenopathy.  Skin:    General: Skin is warm and dry.     Findings: No erythema or rash.  Neurological:     Mental Status: He is alert.     Cranial Nerves: No cranial nerve deficit.  Psychiatric:        Mood and Affect: Mood normal.           Assessment & Plan:   Problem List Items Addressed This Visit      Nervous and Auditory   ETD (Eustachian tube dysfunction), left    L recurrent  Has seen ENT and had multiple sets of tubes in the past Today- relatively mild tx with flonas ns bid for 3 d then daily  If no imp may need prednisone Will watch for s/s of  bacterial infection  Update if not starting to improve in a week or if worsening

## 2019-09-20 ENCOUNTER — Telehealth: Payer: Self-pay | Admitting: Family Medicine

## 2019-09-20 DIAGNOSIS — E669 Obesity, unspecified: Secondary | ICD-10-CM | POA: Insufficient documentation

## 2019-09-20 DIAGNOSIS — E786 Lipoprotein deficiency: Secondary | ICD-10-CM

## 2019-09-20 DIAGNOSIS — M109 Gout, unspecified: Secondary | ICD-10-CM

## 2019-09-20 DIAGNOSIS — Z0000 Encounter for general adult medical examination without abnormal findings: Secondary | ICD-10-CM

## 2019-09-20 DIAGNOSIS — I10 Essential (primary) hypertension: Secondary | ICD-10-CM

## 2019-09-20 DIAGNOSIS — R7303 Prediabetes: Secondary | ICD-10-CM | POA: Insufficient documentation

## 2019-09-20 NOTE — Telephone Encounter (Signed)
-----   Message from Cloyd Stagers, RT sent at 09/19/2019  1:45 PM EDT ----- Regarding: Lab Orders for Thursday 9.24.2020 Please place lab orders for Thursday 9.24.2020, office visit for physical on Tuesday 9.29.2020 Thank you, Dyke Maes RT(R)

## 2019-09-21 ENCOUNTER — Telehealth: Payer: Self-pay | Admitting: Family Medicine

## 2019-09-21 ENCOUNTER — Other Ambulatory Visit (INDEPENDENT_AMBULATORY_CARE_PROVIDER_SITE_OTHER): Payer: 59

## 2019-09-21 DIAGNOSIS — R7303 Prediabetes: Secondary | ICD-10-CM

## 2019-09-21 DIAGNOSIS — E786 Lipoprotein deficiency: Secondary | ICD-10-CM

## 2019-09-21 DIAGNOSIS — I10 Essential (primary) hypertension: Secondary | ICD-10-CM

## 2019-09-21 DIAGNOSIS — M109 Gout, unspecified: Secondary | ICD-10-CM

## 2019-09-21 LAB — CBC WITH DIFFERENTIAL/PLATELET
Basophils Absolute: 0 10*3/uL (ref 0.0–0.1)
Basophils Relative: 0.5 % (ref 0.0–3.0)
Eosinophils Absolute: 0.2 10*3/uL (ref 0.0–0.7)
Eosinophils Relative: 2.8 % (ref 0.0–5.0)
HCT: 44.3 % (ref 39.0–52.0)
Hemoglobin: 15.4 g/dL (ref 13.0–17.0)
Lymphocytes Relative: 35.7 % (ref 12.0–46.0)
Lymphs Abs: 2 10*3/uL (ref 0.7–4.0)
MCHC: 34.7 g/dL (ref 30.0–36.0)
MCV: 86.4 fl (ref 78.0–100.0)
Monocytes Absolute: 0.3 10*3/uL (ref 0.1–1.0)
Monocytes Relative: 6.2 % (ref 3.0–12.0)
Neutro Abs: 3 10*3/uL (ref 1.4–7.7)
Neutrophils Relative %: 54.8 % (ref 43.0–77.0)
Platelets: 180 10*3/uL (ref 150.0–400.0)
RBC: 5.13 Mil/uL (ref 4.22–5.81)
RDW: 13.2 % (ref 11.5–15.5)
WBC: 5.5 10*3/uL (ref 4.0–10.5)

## 2019-09-21 LAB — COMPREHENSIVE METABOLIC PANEL
ALT: 48 U/L (ref 0–53)
AST: 33 U/L (ref 0–37)
Albumin: 4.3 g/dL (ref 3.5–5.2)
Alkaline Phosphatase: 72 U/L (ref 39–117)
BUN: 18 mg/dL (ref 6–23)
CO2: 30 mEq/L (ref 19–32)
Calcium: 9 mg/dL (ref 8.4–10.5)
Chloride: 101 mEq/L (ref 96–112)
Creatinine, Ser: 1.04 mg/dL (ref 0.40–1.50)
GFR: 77.15 mL/min (ref >60.00)
Glucose, Bld: 129 mg/dL — ABNORMAL HIGH (ref 70–99)
Potassium: 3.9 mEq/L (ref 3.5–5.1)
Sodium: 138 mEq/L (ref 135–145)
Total Bilirubin: 0.8 mg/dL (ref 0.2–1.2)
Total Protein: 6.5 g/dL (ref 6.0–8.3)

## 2019-09-21 LAB — TSH: TSH: 1.9 u[IU]/mL (ref 0.35–4.50)

## 2019-09-21 LAB — LIPID PANEL
Cholesterol: 165 mg/dL (ref 0–200)
HDL: 31.2 mg/dL — ABNORMAL LOW (ref >39.00)
LDL Cholesterol: 107 mg/dL — ABNORMAL HIGH (ref 0–99)
NonHDL: 134.04
Total CHOL/HDL Ratio: 5
Triglycerides: 135 mg/dL (ref 0.0–149.0)
VLDL: 27 mg/dL (ref 0.0–40.0)

## 2019-09-21 LAB — URIC ACID: Uric Acid, Serum: 7.2 mg/dL (ref 4.0–7.8)

## 2019-09-21 LAB — HEMOGLOBIN A1C: Hgb A1c MFr Bld: 6.3 % (ref 4.6–6.5)

## 2019-09-21 NOTE — Telephone Encounter (Signed)
-----   Message from Ellamae Sia sent at 09/12/2019  3:02 PM EDT ----- Regarding: Lab orders for Friday, 9.25.20 Patient is scheduled for CPX labs, please order future labs, Thanks , Karna Christmas

## 2019-09-21 NOTE — Telephone Encounter (Signed)
Already done

## 2019-09-22 ENCOUNTER — Other Ambulatory Visit: Payer: 59

## 2019-09-26 ENCOUNTER — Ambulatory Visit (INDEPENDENT_AMBULATORY_CARE_PROVIDER_SITE_OTHER): Payer: 59 | Admitting: Family Medicine

## 2019-09-26 ENCOUNTER — Other Ambulatory Visit: Payer: Self-pay

## 2019-09-26 ENCOUNTER — Encounter: Payer: Self-pay | Admitting: Family Medicine

## 2019-09-26 VITALS — BP 130/85 | HR 69 | Temp 98.0°F | Ht 72.0 in | Wt 314.4 lb

## 2019-09-26 DIAGNOSIS — K219 Gastro-esophageal reflux disease without esophagitis: Secondary | ICD-10-CM

## 2019-09-26 DIAGNOSIS — R7303 Prediabetes: Secondary | ICD-10-CM | POA: Diagnosis not present

## 2019-09-26 DIAGNOSIS — E786 Lipoprotein deficiency: Secondary | ICD-10-CM

## 2019-09-26 DIAGNOSIS — Z23 Encounter for immunization: Secondary | ICD-10-CM

## 2019-09-26 DIAGNOSIS — Z0000 Encounter for general adult medical examination without abnormal findings: Secondary | ICD-10-CM | POA: Diagnosis not present

## 2019-09-26 DIAGNOSIS — M109 Gout, unspecified: Secondary | ICD-10-CM

## 2019-09-26 DIAGNOSIS — F3289 Other specified depressive episodes: Secondary | ICD-10-CM | POA: Diagnosis not present

## 2019-09-26 DIAGNOSIS — I10 Essential (primary) hypertension: Secondary | ICD-10-CM

## 2019-09-26 MED ORDER — LANSOPRAZOLE 30 MG PO CPDR: DELAYED_RELEASE_CAPSULE | ORAL | 3 refills | Status: DC

## 2019-09-26 MED ORDER — SERTRALINE HCL 50 MG PO TABS: 50.0000 mg | ORAL_TABLET | Freq: Every day | ORAL | 3 refills | Status: DC

## 2019-09-26 MED ORDER — HYDROCHLOROTHIAZIDE 25 MG PO TABS: 25.0000 mg | ORAL_TABLET | Freq: Every day | ORAL | 3 refills | Status: DC

## 2019-09-26 MED ORDER — ALLOPURINOL 100 MG PO TABS: 100.0000 mg | ORAL_TABLET | Freq: Every day | ORAL | 3 refills | Status: DC

## 2019-09-26 NOTE — Assessment & Plan Note (Signed)
Pt continues zoloft and mood is stable/even with stress Reviewed stressors/ coping techniques/symptoms/ support sources/ tx options and side effects in detail today Enc good self care/exercise and outdoor time

## 2019-09-26 NOTE — Assessment & Plan Note (Signed)
Disc goals for lipids and reasons to control them Rev last labs with pt Rev low sat fat diet in detail Enc strongly to continue exercise Consider re trial of omega 3 supplement

## 2019-09-26 NOTE — Patient Instructions (Addendum)
To prevent diabetes  Try to get most of your carbohydrates from produce (with the exception of white potatoes)  Eat less bread/pasta/rice/snack foods/cereals/sweets and other items from the middle of the grocery store (processed carbs)   Blood pressure is better on the 2nd check   Keep working on weight loss with healthy diet and exercise   Flu shot today

## 2019-09-26 NOTE — Assessment & Plan Note (Signed)
bp in fair control at this time  BP Readings from Last 1 Encounters:  09/26/19 130/85   No changes needed Most recent labs reviewed  Disc lifstyle change with low sodium diet and exercise  Wt loss encouraged

## 2019-09-26 NOTE — Assessment & Plan Note (Signed)
Lab Results  Component Value Date   LABURIC 7.2 09/21/2019   This is stable with allopurinol and low purine diet  No gout flares Continue to monitor

## 2019-09-26 NOTE — Assessment & Plan Note (Signed)
Reviewed health habits including diet and exercise and skin cancer prevention Reviewed appropriate screening tests for age  Also reviewed health mt list, fam hx and immunization status , as well as social and family history   See HPI Labs reviewed  Flu shot given  Encouraged continued wt loss effort with low glycemic diet and exercise

## 2019-09-26 NOTE — Assessment & Plan Note (Signed)
Lab Results  Component Value Date   HGBA1C 6.3 09/21/2019   disc imp of low glycemic diet and wt loss to prevent DM2

## 2019-09-26 NOTE — Assessment & Plan Note (Signed)
Continues prevacid  Enc to watch diet

## 2019-09-26 NOTE — Progress Notes (Signed)
Subjective:    Patient ID: Samuel Murray, male    DOB: 05/25/1976, 43 y.o.   MRN: [id number]  HPI Here for health maintenance exam and to review chronic medical problems    Has been feeling good overall  Got a puppy  - walking it a lot (4 mi per day)  Goes to the gym /when available  Stretching   Wt Readings from Last 3 Encounters:  09/26/19 (!) 314 lb 6 oz (142.6 kg)  11/28/18 (!) 310 lb (140.6 kg)  05/30/18 295 lb 4 oz (133.9 kg)  does his own meal prep- tries to eat healthy  Portions are too large  Works from home makes it harder  42.64 kg/m   Flu shot-given today   Td 3/11  Prostate health  No problems  No nocturia  No prostate ca in family   PHQ-2  : score of 0 Continues sertraline 50 mg for h/o depression    bp is stable today  No cp or palpitations or headaches or edema  No side effects to medicines  BP Readings from Last 3 Encounters:  09/26/19 (!) 146/82  08/01/19 132/88  11/28/18 130/90    Takes hctz 25 mg  Does not think it has been   Lab Results  Component Value Date   CREATININE 1.04 09/21/2019   BUN 18 09/21/2019   NA 138 09/21/2019   K 3.9 09/21/2019   CL 101 09/21/2019   CO2 30 09/21/2019   Lab Results  Component Value Date   ALT 48 09/21/2019   AST 33 09/21/2019   ALKPHOS 72 09/21/2019   BILITOT 0.8 09/21/2019   Glucose 129  GERD-takes prevacid 30 mg daily  That is controlled   H/o gout  Takes allopurinol 100 mg daily  Lab Results  Component Value Date   LABURIC 7.2 09/21/2019   has not had a gout episode   H/o hyperlipidemia /low HDL Lab Results  Component Value Date   CHOL 165 09/21/2019   CHOL 178 05/30/2018   CHOL 159 05/10/2017   Lab Results  Component Value Date   HDL 31.20 (L) 09/21/2019   HDL 35.70 (L) 05/30/2018   HDL 35.30 (L) 05/10/2017   Lab Results  Component Value Date   LDLCALC 107 (H) 09/21/2019   LDLCALC 123 (H) 05/30/2018   LDLCALC 101 (H) 05/10/2017   Lab Results  Component Value Date    TRIG 135.0 09/21/2019   TRIG 92.0 05/30/2018   TRIG 114.0 05/10/2017   Lab Results  Component Value Date   CHOLHDL 5 09/21/2019   CHOLHDL 5 05/30/2018   CHOLHDL 5 05/10/2017   Lab Results  Component Value Date   LDLDIRECT 132.0 02/11/2010  HDL is chronically low  Had to stop fish oil due to gout    Prediabetes Lab Results  Component Value Date   HGBA1C 6.3 09/21/2019  glucose 129 fasting   Has fire ant bites on ankles Using benadryl   Patient Active Problem List   Diagnosis Date Noted  . Prediabetes 09/20/2019  . Foot pain, bilateral 05/19/2018  . DDD (degenerative disc disease), lumbar 05/12/2017  . Low HDL (under 40) 04/22/2016  . Routine general medical examination at a health care facility 12/06/2015  . Depression 03/13/2015  . Essential hypertension 03/13/2015  . Gout 03/06/2015  . GERD (gastroesophageal reflux disease) 02/05/2013  . Morbid obesity ([facility]) 03/01/2008  . ADD 02/01/2008  . ALLERGIC RHINITIS 02/01/2008  . ASTHMA 02/01/2008   Past Medical History:  Diagnosis Date  . Allergic rhinitis   . Asthma   . Attention deficit disorder 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   . Depression   . Elevated blood pressure reading without diagnosis of hypertension   . Obesity, unspecified    Past Surgical History:  Procedure Laterality Date  . ANKLE SURGERY    . TONSILLECTOMY AND ADENOIDECTOMY     Social History   Tobacco Use  . Smoking status: Former Smoker    Quit date: 12/28/2000    Years since quitting: 18.7  . Smokeless tobacco: Never Used  Substance Use Topics  . Alcohol use: No    Alcohol/week: 0.0 standard drinks  . Drug use: No   Family History  Problem Relation Age of Onset  . Hypertension Father   . Other Father        heart problems  . Heart disease Father   . Hypothyroidism Sister   . Thyroid disease Sister        hypothyroid   Allergies  Allergen Reactions  . Escitalopram Oxalate     REACTION: sedation  . Prozac [Fluoxetine Hcl]      Wt gain    Current Outpatient Medications on File Prior to Visit  Medication Sig Dispense Refill  . colchicine 0.6 MG tablet Take 1 tablet (0.6 mg total) by mouth 2 (two) times daily as needed (for gout flare). 30 tablet 3  . fluticasone (FLONASE) 50 MCG/ACT nasal spray Place 2 sprays into both nostrils daily. 48 g 3  . fluticasone (FLONASE) 50 MCG/ACT nasal spray Place 2 sprays into both nostrils daily. 16 g 6  . naproxen (NAPROSYN) 500 MG tablet   1  . VENTOLIN HFA 108 (90 Base) MCG/ACT inhaler INHALE 2 PUFFS INTO THE LUNGS EVERY 4 (FOUR) HOURS AS NEEDED. 54 g 0   No current facility-administered medications on file prior to visit.     Review of Systems  Constitutional: Negative for activity change, appetite change, fatigue, fever and unexpected weight change.  HENT: Negative for congestion, rhinorrhea, sore throat and trouble swallowing.   Eyes: Negative for pain, redness, itching and visual disturbance.  Respiratory: Negative for cough, chest tightness, shortness of breath and wheezing.   Cardiovascular: Negative for chest pain and palpitations.  Gastrointestinal: Negative for abdominal pain, blood in stool, constipation, diarrhea and nausea.  Endocrine: Negative for cold intolerance, heat intolerance, polydipsia and polyuria.  Genitourinary: Negative for difficulty urinating, dysuria, frequency and urgency.  Musculoskeletal: Positive for arthralgias. Negative for joint swelling and myalgias.  Skin: Negative for pallor and rash.       Itchy ant bites on ankles   Neurological: Negative for dizziness, tremors, weakness, numbness and headaches.  Hematological: Negative for adenopathy. Does not bruise/bleed easily.  Psychiatric/Behavioral: Negative for decreased concentration and dysphoric mood. The patient is not nervous/anxious.        Objective:   Physical Exam Constitutional:      General: He is not in acute distress.    Appearance: Normal appearance. He is well-developed.  He is obese. He is not ill-appearing or diaphoretic.  HENT:     Head: Normocephalic and atraumatic.     Right Ear: Tympanic membrane, ear canal and external ear normal.     Left Ear: Tympanic membrane, ear canal and external ear normal.     Nose: Nose normal. No congestion.     Mouth/Throat:     Mouth: Mucous membranes are moist.     Pharynx: Oropharynx is clear. No posterior oropharyngeal erythema.  Eyes:  General: No scleral icterus.       Right eye: No discharge.        Left eye: No discharge.     Conjunctiva/sclera: Conjunctivae normal.     Pupils: Pupils are equal, round, and reactive to light.  Neck:     Musculoskeletal: Normal range of motion and neck supple. No neck rigidity or muscular tenderness.     Thyroid: No thyromegaly.     Vascular: No carotid bruit or JVD.  Cardiovascular:     Rate and Rhythm: Normal rate and regular rhythm.     Pulses: Normal pulses.     Heart sounds: Normal heart sounds. No gallop.   Pulmonary:     Effort: Pulmonary effort is normal. No respiratory distress.     Breath sounds: Normal breath sounds. No wheezing or rales.     Comments: Good air exch Chest:     Chest wall: No tenderness.  Abdominal:     General: Bowel sounds are normal. There is no distension or abdominal bruit.     Palpations: Abdomen is soft. There is no mass.     Tenderness: There is no abdominal tenderness.     Hernia: No hernia is present.  Musculoskeletal:        General: No tenderness.     Right lower leg: No edema.     Left lower leg: No edema.  Lymphadenopathy:     Cervical: No cervical adenopathy.  Skin:    General: Skin is warm and dry.     Coloration: Skin is not pale.     Findings: No erythema or rash.     Comments: Mildly tanned Some lentigines and few sk  Neurological:     Mental Status: He is alert. Mental status is at baseline.     Cranial Nerves: No cranial nerve deficit.     Motor: No abnormal muscle tone.     Coordination: Coordination normal.      Gait: Gait normal.     Deep Tendon Reflexes: Reflexes are normal and symmetric. Reflexes normal.  Psychiatric:        Mood and Affect: Mood normal.        Cognition and Memory: Cognition and memory normal.     Comments: Pleasant  Mood is good           Assessment & Plan:   Problem List Items Addressed This Visit      Cardiovascular and Mediastinum   Essential hypertension    bp in fair control at this time  BP Readings from Last 1 Encounters:  09/26/19 130/85   No changes needed Most recent labs reviewed  Disc lifstyle change with low sodium diet and exercise  Wt loss encouraged       Relevant Medications   hydrochlorothiazide (HYDRODIURIL) 25 MG tablet     Digestive   GERD (gastroesophageal reflux disease)    Continues prevacid  Enc to watch diet       Relevant Medications   lansoprazole (PREVACID) 30 MG capsule     Other   Morbid obesity ([facility])    Discussed how this problem influences overall health and the risks it imposes  Reviewed plan for weight loss with lower calorie diet (via better food choices and also portion control or program like weight watchers) and exercise building up to or more than 30 minutes 5 days per week including some aerobic activity   Enc plan for low glycemic diet and regular exercise  Enc less snacking while  working at home      Gout    Lab Results  Component Value Date   LABURIC 7.2 09/21/2019   This is stable with allopurinol and low purine diet  No gout flares Continue to monitor      Depression    Pt continues zoloft and mood is stable/even with stress Reviewed stressors/ coping techniques/symptoms/ support sources/ tx options and side effects in detail today Enc good self care/exercise and outdoor time      Relevant Medications   sertraline (ZOLOFT) 50 MG tablet   Routine general medical examination at a health care facility - Primary    Reviewed health habits including diet and exercise and skin cancer  prevention Reviewed appropriate screening tests for age  Also reviewed health mt list, fam hx and immunization status , as well as social and family history   See HPI Labs reviewed  Flu shot given  Encouraged continued wt loss effort with low glycemic diet and exercise        Low HDL (under 40)    Disc goals for lipids and reasons to control them Rev last labs with pt Rev low sat fat diet in detail Enc strongly to continue exercise Consider re trial of omega 3 supplement      Prediabetes    Lab Results  Component Value Date   HGBA1C 6.3 09/21/2019   disc imp of low glycemic diet and wt loss to prevent DM2        Other Visit Diagnoses    Need for influenza vaccination       Relevant Orders   Flu Vaccine QUAD 6+ mos PF IM (Fluarix Quad PF) (Completed)

## 2019-09-26 NOTE — Assessment & Plan Note (Signed)
Discussed how this problem influences overall health and the risks it imposes  Reviewed plan for weight loss with lower calorie diet (via better food choices and also portion control or program like weight watchers) and exercise building up to or more than 30 minutes 5 days per week including some aerobic activity   Enc plan for low glycemic diet and regular exercise  Enc less snacking while working at home

## 2020-02-26 ENCOUNTER — Other Ambulatory Visit: Payer: Self-pay | Admitting: Family Medicine

## 2020-02-27 MED ORDER — ALBUTEROL SULFATE HFA 108 (90 BASE) MCG/ACT IN AERS: 2.0000 | INHALATION_SPRAY | RESPIRATORY_TRACT | 1 refills | Status: AC | PRN

## 2020-08-26 ENCOUNTER — Telehealth: Payer: Self-pay | Admitting: Family Medicine

## 2020-08-26 DIAGNOSIS — M79672 Pain in left foot: Secondary | ICD-10-CM

## 2020-08-26 DIAGNOSIS — M79671 Pain in right foot: Secondary | ICD-10-CM

## 2020-08-26 DIAGNOSIS — M5136 Other intervertebral disc degeneration, lumbar region: Secondary | ICD-10-CM

## 2020-08-26 NOTE — Telephone Encounter (Signed)
Referral Request - Has patient seen PCP for this complaint? Yes.   *If NO, is insurance requiring patient see PCP for this issue before PCP can refer them? Referral for which specialty: Orthopedic   Preferred provider/office: [facility] Reason for referral: pain increasing/ needed by insurance and office. Patient is asking if it can be done by tomorrow as his appointment is tomorrow at 8:45am.

## 2020-08-26 NOTE — Telephone Encounter (Signed)
Referral done Sending to [facility] 

## 2020-08-28 NOTE — Telephone Encounter (Signed)
Noted  

## 2020-10-23 ENCOUNTER — Other Ambulatory Visit: Payer: Self-pay | Admitting: Family Medicine

## 2020-10-24 NOTE — Telephone Encounter (Signed)
Please schedule PE and refill until then  

## 2020-10-24 NOTE — Telephone Encounter (Signed)
Hasn't been seen in over a year and no future appts., please advise  

## 2020-10-25 ENCOUNTER — Other Ambulatory Visit: Payer: Self-pay | Admitting: Family Medicine

## 2020-10-25 NOTE — Telephone Encounter (Signed)
meds filled

## 2020-10-25 NOTE — Telephone Encounter (Signed)
Pt called checking on refills  He schedule labs 11/16 and cpx 11/23

## 2020-11-12 ENCOUNTER — Other Ambulatory Visit: Payer: Self-pay

## 2020-11-12 ENCOUNTER — Other Ambulatory Visit (INDEPENDENT_AMBULATORY_CARE_PROVIDER_SITE_OTHER): Payer: 59

## 2020-11-12 ENCOUNTER — Telehealth (INDEPENDENT_AMBULATORY_CARE_PROVIDER_SITE_OTHER): Payer: 59 | Admitting: Family Medicine

## 2020-11-12 DIAGNOSIS — I10 Essential (primary) hypertension: Secondary | ICD-10-CM

## 2020-11-12 DIAGNOSIS — R7303 Prediabetes: Secondary | ICD-10-CM

## 2020-11-12 DIAGNOSIS — Z0000 Encounter for general adult medical examination without abnormal findings: Secondary | ICD-10-CM

## 2020-11-12 NOTE — Telephone Encounter (Signed)
Lab order

## 2020-11-13 LAB — COMPREHENSIVE METABOLIC PANEL
ALT: 58 U/L — ABNORMAL HIGH (ref 0–53)
AST: 29 U/L (ref 0–37)
Albumin: 4.7 g/dL (ref 3.5–5.2)
Alkaline Phosphatase: 76 U/L (ref 39–117)
BUN: 22 mg/dL (ref 6–23)
CO2: 29 mEq/L (ref 19–32)
Calcium: 9.5 mg/dL (ref 8.4–10.5)
Chloride: 98 mEq/L (ref 96–112)
Creatinine, Ser: 1.07 mg/dL (ref 0.40–1.50)
GFR: 83.31 mL/min (ref >60.00)
Glucose, Bld: 126 mg/dL — ABNORMAL HIGH (ref 70–99)
Potassium: 4.2 mEq/L (ref 3.5–5.1)
Sodium: 137 mEq/L (ref 135–145)
Total Bilirubin: 0.8 mg/dL (ref 0.2–1.2)
Total Protein: 7.1 g/dL (ref 6.0–8.3)

## 2020-11-13 LAB — CBC WITH DIFFERENTIAL/PLATELET
Basophils Absolute: 0 10*3/uL (ref 0.0–0.1)
Basophils Relative: 0.3 % (ref 0.0–3.0)
Eosinophils Absolute: 0 10*3/uL (ref 0.0–0.7)
Eosinophils Relative: 0.1 % (ref 0.0–5.0)
HCT: 44.7 % (ref 39.0–52.0)
Hemoglobin: 15.2 g/dL (ref 13.0–17.0)
Lymphocytes Relative: 13.2 % (ref 12.0–46.0)
Lymphs Abs: 2.2 10*3/uL (ref 0.7–4.0)
MCHC: 34 g/dL (ref 30.0–36.0)
MCV: 86.2 fl (ref 78.0–100.0)
Monocytes Absolute: 0.8 10*3/uL (ref 0.1–1.0)
Monocytes Relative: 4.7 % (ref 3.0–12.0)
Neutro Abs: 13.7 10*3/uL — ABNORMAL HIGH (ref 1.4–7.7)
Neutrophils Relative %: 81.7 % — ABNORMAL HIGH (ref 43.0–77.0)
Platelets: 200 10*3/uL (ref 150.0–400.0)
RBC: 5.18 Mil/uL (ref 4.22–5.81)
RDW: 13.7 % (ref 11.5–15.5)
WBC: 16.7 10*3/uL — ABNORMAL HIGH (ref 4.0–10.5)

## 2020-11-13 LAB — LIPID PANEL
Cholesterol: 180 mg/dL (ref 0–200)
HDL: 38.8 mg/dL — ABNORMAL LOW (ref >39.00)
LDL Cholesterol: 122 mg/dL — ABNORMAL HIGH (ref 0–99)
NonHDL: 141.26
Total CHOL/HDL Ratio: 5
Triglycerides: 95 mg/dL (ref 0.0–149.0)
VLDL: 19 mg/dL (ref 0.0–40.0)

## 2020-11-13 LAB — HEMOGLOBIN A1C: Hgb A1c MFr Bld: 6.4 % (ref 4.6–6.5)

## 2020-11-13 LAB — TSH: TSH: 0.46 u[IU]/mL (ref 0.35–4.50)

## 2020-11-19 ENCOUNTER — Encounter: Payer: Self-pay | Admitting: Family Medicine

## 2020-11-19 ENCOUNTER — Other Ambulatory Visit: Payer: Self-pay | Admitting: Family Medicine

## 2020-11-19 ENCOUNTER — Other Ambulatory Visit: Payer: Self-pay

## 2020-11-19 ENCOUNTER — Ambulatory Visit (INDEPENDENT_AMBULATORY_CARE_PROVIDER_SITE_OTHER): Payer: No Typology Code available for payment source | Admitting: Family Medicine

## 2020-11-19 VITALS — BP 131/88 | HR 78 | Temp 97.1°F | Ht 72.0 in | Wt 319.4 lb

## 2020-11-19 DIAGNOSIS — Z0000 Encounter for general adult medical examination without abnormal findings: Secondary | ICD-10-CM

## 2020-11-19 DIAGNOSIS — L600 Ingrowing nail: Secondary | ICD-10-CM

## 2020-11-19 DIAGNOSIS — I10 Essential (primary) hypertension: Secondary | ICD-10-CM

## 2020-11-19 DIAGNOSIS — F419 Anxiety disorder, unspecified: Secondary | ICD-10-CM

## 2020-11-19 DIAGNOSIS — E786 Lipoprotein deficiency: Secondary | ICD-10-CM

## 2020-11-19 DIAGNOSIS — F32A Depression, unspecified: Secondary | ICD-10-CM

## 2020-11-19 DIAGNOSIS — R7303 Prediabetes: Secondary | ICD-10-CM

## 2020-11-19 MED ORDER — FLUTICASONE PROPIONATE 50 MCG/ACT NA SUSP: 2.0000 | Freq: Every day | NASAL | 3 refills | Status: DC

## 2020-11-19 MED ORDER — SERTRALINE HCL 100 MG PO TABS: 100.0000 mg | ORAL_TABLET | Freq: Every day | ORAL | 3 refills | Status: DC

## 2020-11-19 MED ORDER — LANSOPRAZOLE 30 MG PO CPDR
DELAYED_RELEASE_CAPSULE | ORAL | 3 refills | Status: DC
Start: 2020-11-19 — End: 2020-11-19

## 2020-11-19 MED ORDER — HYDROCHLOROTHIAZIDE 25 MG PO TABS
25.0000 mg | ORAL_TABLET | Freq: Every day | ORAL | 3 refills | Status: DC
Start: 2020-11-19 — End: 2020-11-19

## 2020-11-19 NOTE — Assessment & Plan Note (Signed)
He will need podiatry referral  Pt wants to check with insurance first and call us back

## 2020-11-19 NOTE — Assessment & Plan Note (Signed)
Discussed how this problem influences overall health and the risks it imposes  Reviewed plan for weight loss with lower calorie diet (via better food choices and also portion control or program like weight watchers) and exercise building up to or more than 30 minutes 5 days per week including some aerobic activity   Suggested NOOM progam for emotional eating  Enc him to keep up exercise  He plans to use myfitness pal to track also

## 2020-11-19 NOTE — Assessment & Plan Note (Signed)
bp in fair control at this time  BP Readings from Last 1 Encounters:  11/19/20 131/88   No changes needed Most recent labs reviewed  Disc lifstyle change with low sodium diet and exercise  Plan to continue hctz 25 mg daily  Also keep working on wt loss

## 2020-11-19 NOTE — Assessment & Plan Note (Signed)
Worse anxiety lately with inc stressors  Reviewed stressors/ coping techniques/symptoms/ support sources/ tx options and side effects in detail today  Eating to compensate  Offered counseling ref-he will think about it  Enc good self care and exercise  Suggested the NOOM program for emotional eating  Plan to inc sertraline to 75 mg daily for 2 wk and then to 100 mg daily if well tolerated Discussed expectations of SSRI medication including time to effectiveness and mechanism of action, also poss of side effects (early and late)- including mental fuzziness, weight or appetite change, nausea and poss of worse dep or anxiety (even suicidal thoughts)  Pt voiced understanding and will stop med and update if this occurs   Pt will update in 1-2 mo

## 2020-11-19 NOTE — Assessment & Plan Note (Signed)
Reviewed health habits including diet and exercise and skin cancer prevention Reviewed appropriate screening tests for age  Also reviewed health mt list, fam hx and immunization status , as well as social and family history   See HPI Labs reviewed  Discussed strategies for wt loss  Pt declines covid vaccine for now- questions answered  Declines tetanus update today

## 2020-11-19 NOTE — Patient Instructions (Addendum)
I want to increase your sertraline to 75 mg for 2 weeks then 100 mg daily (as tolerated)   Let me know if no improvement in 1-2 months    I like the apps Calm and Headspace for meditation   For prediabetes Try to get most of your carbohydrates from produce (with the exception of white potatoes)  Eat less bread/pasta/rice/snack foods/cereals/sweets and other items from the middle of the grocery store (processed carbs)  The NOOM program is helpful for weight management   Let us know if you want a referral for counseling   Take care of yourself

## 2020-11-19 NOTE — Assessment & Plan Note (Signed)
Lab Results  Component Value Date   HGBA1C 6.4 11/12/2020   disc imp of low glycemic diet and wt loss to prevent DM2  Plans to work on diet/wt loss

## 2020-11-19 NOTE — Assessment & Plan Note (Signed)
HDL went up a bit but LDL is also up with worse eating  Disc goals for lipids and reasons to control them Rev last labs with pt Rev low sat fat diet in detail Pt plans to work on diet and re check

## 2020-11-19 NOTE — Progress Notes (Signed)
Subjective:    Patient ID: Samuel Murray, male    DOB: Dec 01, 1976, 44 y.o.   MRN: [id number]  This visit occurred during the SARS-CoV-2 public health emergency.  Safety protocols were in place, including screening questions prior to the visit, additional usage of staff PPE, and extensive cleaning of exam room while observing appropriate contact time as indicated for disinfecting solutions.    HPI Here for health maintenance exam and to review chronic medical problems    Wt Readings from Last 3 Encounters:  11/19/20 (!) 319 lb 7 oz (144.9 kg)  09/26/19 (!) 314 lb 6 oz (142.6 kg)  11/28/18 (!) 310 lb (140.6 kg)   43.32 kg/m  Has struggled trying to loose weight     Doing ok overall  Working  Taking fair care of himself   Exercise 5 days per week- power lifting/ strength work  A lot of cardio as well -would like to do more   2 sets of back injections = pars and bulging disc both  Doing PT as well  This took him out of exercise for 2 months    covid status  Has not had covid or vaccine  Declines vaccine for now    Td 3/11 Does not want update tetanus yet    Flu shot 10/21  HTN bp is stable today  No cp or palpitations or headaches or edema  No side effects to medicines  BP Readings from Last 3 Encounters:  11/19/20 (!) 148/92  09/26/19 130/85  08/01/19 132/88   120s/80s at home On re check today BP: 131/88     Pulse Readings from Last 3 Encounters:  11/19/20 78  09/26/19 69  08/01/19 71  bp went up during his injections  Monitors at home   Takes hctz 25 mg daily  Depression  Takes zoloft  Thinks his anxiety is worse than he originally thought  Cannot turn off his brain  Feels like his heart beats hard (not fast and no palpitations)  Little things make him anxious  He did quit caffeine  Feels panicky when he eats (has panic attacks)   He has ADD and took ritalin in college   Stress level is higher as well    Lab Results  Component Value  Date   CREATININE 1.07 11/12/2020   BUN 22 11/12/2020   NA 137 11/12/2020   K 4.2 11/12/2020   CL 98 11/12/2020   CO2 29 11/12/2020   Prediabetes Lab Results  Component Value Date   HGBA1C 6.4 11/12/2020   This is up from 6.3 Anxiety makes him eat more  Eats worse later in the day and on the weekends  Got tired of doing the pre prepping for meals /small portions  Wants to get back to myfitnesspal    Lab Results  Component Value Date   TSH 0.46 11/12/2020   Lab Results  Component Value Date   ALT 58 (H) 11/12/2020   AST 29 11/12/2020   ALKPHOS 76 11/12/2020   BILITOT 0.8 11/12/2020    One liver enzyme is elevated  No etoh  Thinks fatty liver  No tylenol   H/o low HDL Lab Results  Component Value Date   CHOL 180 11/12/2020   CHOL 165 09/21/2019   CHOL 178 05/30/2018   Lab Results  Component Value Date   HDL 38.80 (L) 11/12/2020   HDL 31.20 (L) 09/21/2019   HDL 35.70 (L) 05/30/2018   Lab Results  Component Value Date  LDLCALC 122 (H) 11/12/2020   LDLCALC 107 (H) 09/21/2019   LDLCALC 123 (H) 05/30/2018   Lab Results  Component Value Date   TRIG 95.0 11/12/2020   TRIG 135.0 09/21/2019   TRIG 92.0 05/30/2018   Lab Results  Component Value Date   CHOLHDL 5 11/12/2020   CHOLHDL 5 09/21/2019   CHOLHDL 5 05/30/2018   Lab Results  Component Value Date   LDLDIRECT 132.0 02/11/2010   Wbc is elevated Lab Results  Component Value Date   WBC 16.7 (H) 11/12/2020   HGB 15.2 11/12/2020   HCT 44.7 11/12/2020   MCV 86.2 11/12/2020   PLT 200.0 11/12/2020  just had a steroid injection before this cbc   Patient Active Problem List   Diagnosis Date Noted  . Ingrown toenail of left foot 11/19/2020  . Prediabetes 09/20/2019  . Foot pain, bilateral 05/19/2018  . DDD (degenerative disc disease), lumbar 05/12/2017  . Low HDL (under 40) 04/22/2016  . Routine general medical examination at a health care facility 12/06/2015  . Anxiety and depression 03/13/2015   . Essential hypertension 03/13/2015  . Gout 03/06/2015  . GERD (gastroesophageal reflux disease) 02/05/2013  . Morbid obesity (HCC) 03/01/2008  . ADD 02/01/2008  . ALLERGIC RHINITIS 02/01/2008  . ASTHMA 02/01/2008   Past Medical History:  Diagnosis Date  . Allergic rhinitis   . Asthma   . Attention deficit disorder 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   . Depression   . Elevated blood pressure reading without diagnosis of hypertension   . Obesity, unspecified    Past Surgical History:  Procedure Laterality Date  . ANKLE SURGERY    . TONSILLECTOMY AND ADENOIDECTOMY     Social History   Tobacco Use  . Smoking status: Former Smoker    Quit date: 12/28/2000    Years since quitting: 19.9  . Smokeless tobacco: Never Used  Substance Use Topics  . Alcohol use: No    Alcohol/week: 0.0 standard drinks  . Drug use: No   Family History  Problem Relation Age of Onset  . Hypertension Father   . Other Father        heart problems  . Heart disease Father   . Hypothyroidism Sister   . Thyroid disease Sister        hypothyroid   Allergies  Allergen Reactions  . Escitalopram Oxalate     REACTION: sedation  . Prozac [Fluoxetine Hcl]     Wt gain    Current Outpatient Medications on File Prior to Visit  Medication Sig Dispense Refill  . albuterol (VENTOLIN HFA) 108 (90 Base) MCG/ACT inhaler Inhale 2 puffs into the lungs every 4 (four) hours as needed. 54 g 1  . allopurinol (ZYLOPRIM) 100 MG tablet TAKE 1 TABLET BY MOUTH DAILY. 90 tablet 0  . colchicine 0.6 MG tablet Take 1 tablet (0.6 mg total) by mouth 2 (two) times daily as needed (for gout flare). 30 tablet 3   No current facility-administered medications on file prior to visit.    Review of Systems  Constitutional: Negative for activity change, appetite change, fatigue, fever and unexpected weight change.  HENT: Negative for congestion, rhinorrhea, sore throat and trouble swallowing.   Eyes: Negative for pain, redness, itching  and visual disturbance.  Respiratory: Negative for cough, chest tightness, shortness of breath and wheezing.   Cardiovascular: Negative for chest pain and palpitations.  Gastrointestinal: Negative for abdominal pain, blood in stool, constipation, diarrhea and nausea.  Endocrine: Negative for cold intolerance, heat intolerance,  polydipsia and polyuria.  Genitourinary: Negative for difficulty urinating, dysuria, frequency and urgency.  Musculoskeletal: Positive for arthralgias and back pain. Negative for joint swelling and myalgias.  Skin: Negative for pallor and rash.  Neurological: Negative for dizziness, tremors, weakness, numbness and headaches.  Hematological: Negative for adenopathy. Does not bruise/bleed easily.  Psychiatric/Behavioral: Negative for decreased concentration and dysphoric mood. The patient is not nervous/anxious.        Objective:   Physical Exam Constitutional:      General: He is not in acute distress.    Appearance: Normal appearance. He is well-developed. He is obese. He is not ill-appearing or diaphoretic.  HENT:     Head: Normocephalic and atraumatic.     Right Ear: Tympanic membrane, ear canal and external ear normal.     Left Ear: Tympanic membrane, ear canal and external ear normal.     Nose: Nose normal. No congestion.     Mouth/Throat:     Mouth: Mucous membranes are moist.     Pharynx: Oropharynx is clear. No posterior oropharyngeal erythema.  Eyes:     General: No scleral icterus.       Right eye: No discharge.        Left eye: No discharge.     Conjunctiva/sclera: Conjunctivae normal.     Pupils: Pupils are equal, round, and reactive to light.  Neck:     Thyroid: No thyromegaly.     Vascular: No carotid bruit or JVD.  Cardiovascular:     Rate and Rhythm: Normal rate and regular rhythm.     Pulses: Normal pulses.     Heart sounds: Normal heart sounds. No gallop.   Pulmonary:     Effort: Pulmonary effort is normal. No respiratory distress.      Breath sounds: Normal breath sounds. No wheezing or rales.     Comments: Good air exch Chest:     Chest wall: No tenderness.  Abdominal:     General: Bowel sounds are normal. There is no distension or abdominal bruit.     Palpations: Abdomen is soft. There is no mass.     Tenderness: There is no abdominal tenderness.     Hernia: No hernia is present.  Musculoskeletal:        General: No tenderness.     Cervical back: Normal range of motion and neck supple. No rigidity. No muscular tenderness.     Right lower leg: No edema.     Left lower leg: No edema.     Comments: No acute joint changes  Lymphadenopathy:     Cervical: No cervical adenopathy.  Skin:    General: Skin is warm and dry.     Coloration: Skin is not pale.     Findings: No erythema or rash.     Comments: Solar lentigines diffusely   Neurological:     Mental Status: He is alert.     Cranial Nerves: No cranial nerve deficit.     Motor: No abnormal muscle tone.     Coordination: Coordination normal.     Gait: Gait normal.     Deep Tendon Reflexes: Reflexes are normal and symmetric. Reflexes normal.  Psychiatric:        Mood and Affect: Mood is anxious.        Cognition and Memory: Cognition and memory normal.     Comments: Candidly discusses anxiety           Assessment & Plan:   Problem List Items Addressed This  Visit      Cardiovascular and Mediastinum   Essential hypertension    bp in fair control at this time  BP Readings from Last 1 Encounters:  11/19/20 131/88   No changes needed Most recent labs reviewed  Disc lifstyle change with low sodium diet and exercise  Plan to continue hctz 25 mg daily  Also keep working on wt loss      Relevant Medications   hydrochlorothiazide (HYDRODIURIL) 25 MG tablet     Musculoskeletal and Integument   Ingrown toenail of left foot    He will need podiatry referral  Pt wants to check with insurance first and call us back        Other   Morbid obesity  (HCC)    Discussed how this problem influences overall health and the risks it imposes  Reviewed plan for weight loss with lower calorie diet (via better food choices and also portion control or program like weight watchers) and exercise building up to or more than 30 minutes 5 days per week including some aerobic activity   Suggested NOOM progam for emotional eating  Enc him to keep up exercise  He plans to use myfitness pal to track also      Anxiety and depression    Worse anxiety lately with inc stressors  Reviewed stressors/ coping techniques/symptoms/ support sources/ tx options and side effects in detail today  Eating to compensate  Offered counseling ref-he will think about it  Enc good self care and exercise  Suggested the NOOM program for emotional eating  Plan to inc sertraline to 75 mg daily for 2 wk and then to 100 mg daily if well tolerated Discussed expectations of SSRI medication including time to effectiveness and mechanism of action, also poss of side effects (early and late)- including mental fuzziness, weight or appetite change, nausea and poss of worse dep or anxiety (even suicidal thoughts)  Pt voiced understanding and will stop med and update if this occurs   Pt will update in 1-2 mo        Relevant Medications   sertraline (ZOLOFT) 100 MG tablet   Routine general medical examination at a health care facility - Primary    Reviewed health habits including diet and exercise and skin cancer prevention Reviewed appropriate screening tests for age  Also reviewed health mt list, fam hx and immunization status , as well as social and family history   See HPI Labs reviewed  Discussed strategies for wt loss  Pt declines covid vaccine for now- questions answered  Declines tetanus update today        Low HDL (under 40)    HDL went up a bit but LDL is also up with worse eating  Disc goals for lipids and reasons to control them Rev last labs with pt Rev low sat fat  diet in detail Pt plans to work on diet and re check      Prediabetes    Lab Results  Component Value Date   HGBA1C 6.4 11/12/2020   disc imp of low glycemic diet and wt loss to prevent DM2  Plans to work on diet/wt loss

## 2020-11-20 ENCOUNTER — Encounter: Payer: Self-pay | Admitting: Family Medicine

## 2020-11-20 DIAGNOSIS — L600 Ingrowing nail: Secondary | ICD-10-CM

## 2020-11-23 NOTE — Telephone Encounter (Signed)
Podiatry referral

## 2021-01-20 ENCOUNTER — Other Ambulatory Visit: Payer: Self-pay | Admitting: Family Medicine

## 2021-01-21 ENCOUNTER — Other Ambulatory Visit: Payer: Self-pay | Admitting: Family Medicine

## 2021-01-21 NOTE — Telephone Encounter (Signed)
Pharmacy requests refill on: Hydrochlorothiazide 25 mg & Lansoprazole 30 mg   LAST REFILL: 11/19/2020 (Q-90, R-3) LAST OV: 11/19/2020 NEXT OV: Not Scheduled  PHARMACY: [facility]  *TOO EARLY FOR REFILL*  Pharmacy requests refill on: Allopurinol 100 mg   LAST REFILL: 10/25/2020 (Q-90, R-0) LAST OV: 11/19/2020 NEXT OV: Not Scheduled  PHARMACY: [facility]

## 2021-04-22 ENCOUNTER — Other Ambulatory Visit: Payer: Self-pay

## 2021-04-22 MED FILL — Lansoprazole Cap Delayed Release 30 MG: ORAL | 90 days supply | Qty: 90 | Fill #0 | Status: AC

## 2021-04-22 MED FILL — Hydrochlorothiazide Tab 25 MG: ORAL | 90 days supply | Qty: 90 | Fill #0 | Status: AC

## 2021-04-22 MED FILL — Allopurinol Tab 100 MG: ORAL | 90 days supply | Qty: 90 | Fill #0 | Status: AC

## 2021-05-05 ENCOUNTER — Other Ambulatory Visit: Payer: Self-pay

## 2021-08-05 ENCOUNTER — Other Ambulatory Visit: Payer: Self-pay

## 2021-08-05 ENCOUNTER — Other Ambulatory Visit: Payer: Self-pay | Admitting: *Deleted

## 2021-08-05 MED ORDER — ALLOPURINOL 100 MG PO TABS: ORAL_TABLET | Freq: Every day | ORAL | 1 refills | Status: DC

## 2021-08-05 NOTE — Telephone Encounter (Signed)
Patient called stating that he is out of refills on his Allopurinol. Patient requested that a refill be sent to [facility], [place]

## 2021-08-05 NOTE — Telephone Encounter (Signed)
Last CPE was 11/19/20

## 2021-09-22 ENCOUNTER — Other Ambulatory Visit: Payer: Self-pay | Admitting: Family Medicine

## 2021-09-24 NOTE — Telephone Encounter (Signed)
Pt is due for CPE in Nov, please call and get it scheduled.  Meds declined because to soon to fill

## 2021-09-24 NOTE — Telephone Encounter (Signed)
Lvm to schedule CPE

## 2021-11-06 ENCOUNTER — Encounter: Payer: Self-pay | Admitting: Family Medicine

## 2021-11-06 ENCOUNTER — Other Ambulatory Visit: Payer: Self-pay

## 2021-11-06 ENCOUNTER — Ambulatory Visit (INDEPENDENT_AMBULATORY_CARE_PROVIDER_SITE_OTHER): Payer: BC Managed Care – PPO | Admitting: Family Medicine

## 2021-11-06 VITALS — BP 130/82 | HR 76 | Temp 98.0°F | Ht 72.0 in | Wt 290.0 lb

## 2021-11-06 DIAGNOSIS — M109 Gout, unspecified: Secondary | ICD-10-CM | POA: Diagnosis not present

## 2021-11-06 DIAGNOSIS — Z0000 Encounter for general adult medical examination without abnormal findings: Secondary | ICD-10-CM | POA: Diagnosis not present

## 2021-11-06 DIAGNOSIS — E786 Lipoprotein deficiency: Secondary | ICD-10-CM

## 2021-11-06 DIAGNOSIS — F32A Depression, unspecified: Secondary | ICD-10-CM

## 2021-11-06 DIAGNOSIS — F419 Anxiety disorder, unspecified: Secondary | ICD-10-CM

## 2021-11-06 DIAGNOSIS — R7303 Prediabetes: Secondary | ICD-10-CM

## 2021-11-06 DIAGNOSIS — I10 Essential (primary) hypertension: Secondary | ICD-10-CM

## 2021-11-06 DIAGNOSIS — Z1211 Encounter for screening for malignant neoplasm of colon: Secondary | ICD-10-CM | POA: Insufficient documentation

## 2021-11-06 DIAGNOSIS — Z23 Encounter for immunization: Secondary | ICD-10-CM | POA: Diagnosis not present

## 2021-11-06 LAB — CBC WITH DIFFERENTIAL/PLATELET
Basophils Absolute: 0 10*3/uL (ref 0.0–0.1)
Basophils Relative: 0.3 % (ref 0.0–3.0)
Eosinophils Absolute: 0.1 10*3/uL (ref 0.0–0.7)
Eosinophils Relative: 1.8 % (ref 0.0–5.0)
HCT: 44.6 % (ref 39.0–52.0)
Hemoglobin: 15.1 g/dL (ref 13.0–17.0)
Lymphocytes Relative: 32.8 % (ref 12.0–46.0)
Lymphs Abs: 1.8 10*3/uL (ref 0.7–4.0)
MCHC: 33.9 g/dL (ref 30.0–36.0)
MCV: 87.1 fl (ref 78.0–100.0)
Monocytes Absolute: 0.4 10*3/uL (ref 0.1–1.0)
Monocytes Relative: 6.6 % (ref 3.0–12.0)
Neutro Abs: 3.1 10*3/uL (ref 1.4–7.7)
Neutrophils Relative %: 58.5 % (ref 43.0–77.0)
Platelets: 170 10*3/uL (ref 150.0–400.0)
RBC: 5.12 Mil/uL (ref 4.22–5.81)
RDW: 13.3 % (ref 11.5–15.5)
WBC: 5.4 10*3/uL (ref 4.0–10.5)

## 2021-11-06 LAB — URIC ACID: Uric Acid, Serum: 6.3 mg/dL (ref 4.0–7.8)

## 2021-11-06 LAB — LIPID PANEL
Cholesterol: 159 mg/dL (ref 0–200)
HDL: 31.7 mg/dL — ABNORMAL LOW (ref >39.00)
LDL Cholesterol: 104 mg/dL — ABNORMAL HIGH (ref 0–99)
NonHDL: 127.03
Total CHOL/HDL Ratio: 5
Triglycerides: 117 mg/dL (ref 0.0–149.0)
VLDL: 23.4 mg/dL (ref 0.0–40.0)

## 2021-11-06 LAB — COMPREHENSIVE METABOLIC PANEL
ALT: 21 U/L (ref 0–53)
AST: 20 U/L (ref 0–37)
Albumin: 4.6 g/dL (ref 3.5–5.2)
Alkaline Phosphatase: 79 U/L (ref 39–117)
BUN: 26 mg/dL — ABNORMAL HIGH (ref 6–23)
CO2: 32 mEq/L (ref 19–32)
Calcium: 9.1 mg/dL (ref 8.4–10.5)
Chloride: 102 mEq/L (ref 96–112)
Creatinine, Ser: 1.07 mg/dL (ref 0.40–1.50)
GFR: 82.73 mL/min (ref >60.00)
Glucose, Bld: 97 mg/dL (ref 70–99)
Potassium: 4.4 mEq/L (ref 3.5–5.1)
Sodium: 141 mEq/L (ref 135–145)
Total Bilirubin: 1 mg/dL (ref 0.2–1.2)
Total Protein: 6.8 g/dL (ref 6.0–8.3)

## 2021-11-06 LAB — HEMOGLOBIN A1C: Hgb A1c MFr Bld: 5.9 % (ref 4.6–6.5)

## 2021-11-06 LAB — TSH: TSH: 1.39 u[IU]/mL (ref 0.35–5.50)

## 2021-11-06 MED ORDER — SERTRALINE HCL 100 MG PO TABS: ORAL_TABLET | Freq: Every day | ORAL | 3 refills | Status: DC

## 2021-11-06 MED ORDER — LANSOPRAZOLE 30 MG PO CPDR: DELAYED_RELEASE_CAPSULE | Freq: Every day | ORAL | 3 refills | Status: DC

## 2021-11-06 MED ORDER — HYDROCHLOROTHIAZIDE 25 MG PO TABS: ORAL_TABLET | Freq: Every day | ORAL | 3 refills | Status: DC

## 2021-11-06 NOTE — Assessment & Plan Note (Signed)
Discussed how this problem influences overall health and the risks it imposes  Reviewed plan for weight loss with lower calorie diet (via better food choices and also portion control or program like weight watchers) and exercise building up to or more than 30 minutes 5 days per week including some aerobic activity   Commended with wt loss so far, enc to keep it up with healthy diet and exercise

## 2021-11-06 NOTE — Assessment & Plan Note (Signed)
Lipid panel today  Disc goals for lipids and reasons to control them Rev last labs with pt Rev low sat fat diet in detail Exercise and diet have improved

## 2021-11-06 NOTE — Assessment & Plan Note (Signed)
D/w patient [id number] for colon cancer screening, including IFOB vs. colonoscopy.  Risks and benefits of both were discussed and patient voiced understanding.  Pt plans to think about it and call back with choice

## 2021-11-06 NOTE — Assessment & Plan Note (Signed)
a1c today  Diet has been better and wt loss noted  disc imp of low glycemic diet and wt loss to prevent DM2

## 2021-11-06 NOTE — Assessment & Plan Note (Signed)
No recent flares Controlled with allopurinol and diet  Labs today

## 2021-11-06 NOTE — Patient Instructions (Addendum)
Find out if your insurance covers cologuard  Let us know if you want to do that or colonoscopy (or ifob kit)   Labs today  [place] job with weight loss so far   Meditation apps are good - Calm and headspace are good   Take care of yourself   Flu shot and tetanus shot today

## 2021-11-06 NOTE — Assessment & Plan Note (Signed)
Reviewed health habits including diet and exercise and skin cancer prevention Reviewed appropriate screening tests for age  Also reviewed health mt list, fam hx and immunization status , as well as social and family history   See HPI Labs ordered Enc further wt loss  Declines covid vaccination  Flu shot given Tdap vaccine given Reviewed options for colon cancer screen incl colonoscopy and ifob and cologuard He is unsure what he will choose -plans to call back and voiced understanding  No prostate concerns  May have interest in testosterone level check in the future

## 2021-11-06 NOTE — Assessment & Plan Note (Signed)
Reviewed PHQ score of 8 Overall pt thinks he is doing well and mood does not affect his day to day activities Reviewed stressors/ coping techniques/symptoms/ support sources/ tx options and side effects in detail today  Thinks anxiety is a bigger problem than depression  No SI Plan to continue zoloft 100 mg daily  Tolerates well  Encouraged good self care

## 2021-11-06 NOTE — Assessment & Plan Note (Signed)
bp in fair control at this time  BP Readings from Last 1 Encounters:  11/06/21 130/82   No changes needed Most recent labs reviewed  Disc lifstyle change with low sodium diet and exercise  Plan to continue hctz 25 mg daily  Labs today

## 2021-11-06 NOTE — Progress Notes (Signed)
Subjective:    Patient ID: Samuel Murray, male    DOB: 23-Oct-1976, 45 y.o.   MRN: [id number]  This visit occurred during the SARS-CoV-2 public health emergency.  Safety protocols were in place, including screening questions prior to the visit, additional usage of staff PPE, and extensive cleaning of exam room while observing appropriate contact time as indicated for disinfecting solutions.   HPI Here for health maintenance exam and to review chronic medical problems    Wt Readings from Last 3 Encounters:  11/06/21 290 lb (131.5 kg)  11/19/20 (!) 319 lb 7 oz (144.9 kg)  09/26/19 (!) 314 lb 6 oz (142.6 kg)   39.33 kg/m Doing well overall   Lost wt 0-down from 320 to 285 lb Eating less and eating smarter /uses myfitnesspal and it works for him  Portions are smaller  Out of his slump   Would like to get down to 275   Exercise 5 days per week, power lifting and some cardio   Covid status-not immunized, declines  Flu shot -today Tetanus shot due will do also  Colon cancer screening : has not had yet  Interested to learn about options    Prostate health No urinary changes No nocturia No family history   Family hx : father had MI in 42s    Due for labs   HTN bp is stable today  No cp or palpitations or headaches or edema  No side effects to medicines  BP Readings from Last 3 Encounters:  11/06/21 130/82  11/19/20 131/88  09/26/19 130/85     Takes hctz 25 mg daily   Lab Results  Component Value Date   CREATININE 1.07 11/12/2020   BUN 22 11/12/2020   NA 137 11/12/2020   K 4.2 11/12/2020   CL 98 11/12/2020   CO2 29 11/12/2020   GERD  Prevacid 30 mg daily   Lipids  Lab Results  Component Value Date   CHOL 180 11/12/2020   HDL 38.80 (L) 11/12/2020   LDLCALC 122 (H) 11/12/2020   LDLDIRECT 132.0 02/11/2010   TRIG 95.0 11/12/2020   CHOLHDL 5 11/12/2020    The 10-year ASCVD risk score (Arnett DK, et al., 2019) is: 3.5%   Values used to calculate the  score:     Age: 59 years     Sex: Male     Is Non-Hispanic African American: No     Diabetic: No     Tobacco smoker: No     Systolic Blood Pressure: 130 mmHg     Is BP treated: Yes     HDL Cholesterol: 38.8 mg/dL     Total Cholesterol: 180 mg/dL  Prediabetes Lab Results  Component Value Date   HGBA1C 6.4 11/12/2020   Mood :  Overall stable  Thinks he has had more anxiety than depression  Would like to continue sertraline at 100 mg daily  Declines counseling  Stress is up and down  Depression screen [facility] 2/9 11/06/2021 11/19/2020 09/26/2019 05/30/2018  Decreased Interest 1 1 0 0  Down, Depressed, Hopeless 1 1 0 0  PHQ - 2 Score 2 2 0 0  Altered sleeping 1 2 0 -  Tired, decreased energy 0 2 0 -  Change in appetite 1 2 0 -  Feeling bad or failure about yourself  1 1 0 -  Trouble concentrating 1 1 0 -  Moving slowly or fidgety/restless 2 0 0 -  Suicidal thoughts 0 0 0 -  PHQ-9  Score 8 10 0 -  Difficult doing work/chores Not difficult at all Somewhat difficult Not difficult at all -     No gout flares Allopurinol 100 mg    Patient Active Problem List   Diagnosis Date Noted   Colon cancer screening 11/06/2021   Prediabetes 09/20/2019   Foot pain, bilateral 05/19/2018   DDD (degenerative disc disease), lumbar 05/12/2017   Low HDL (under 40) 04/22/2016   Routine general medical examination at a health care facility 12/06/2015   Anxiety and depression 03/13/2015   Essential hypertension 03/13/2015   Gout 03/06/2015   GERD (gastroesophageal reflux disease) 02/05/2013   Morbid obesity ([place]) 03/01/2008   ADD 02/01/2008   ALLERGIC RHINITIS 02/01/2008   ASTHMA 02/01/2008   Past Medical History:  Diagnosis Date   Allergic rhinitis    Asthma    Attention deficit disorder 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    Depression    Elevated blood pressure reading without diagnosis of hypertension    Obesity, unspecified    Past Surgical History:  Procedure Laterality Date    ANKLE SURGERY     TONSILLECTOMY AND ADENOIDECTOMY     Social History   Tobacco Use   Smoking status: Former    Types: Cigarettes    Quit date: 12/28/2000    Years since quitting: 20.8   Smokeless tobacco: Never  Substance Use Topics   Alcohol use: No    Alcohol/week: 0.0 standard drinks   Drug use: No   Family History  Problem Relation Age of Onset   Hypertension Father    Other Father        heart problems   Heart disease Father    Hypothyroidism Sister    Thyroid disease Sister        hypothyroid   Allergies  Allergen Reactions   Escitalopram Oxalate     REACTION: sedation   Prozac [Fluoxetine Hcl]     Wt gain    Current Outpatient Medications on File Prior to Visit  Medication Sig Dispense Refill   albuterol (VENTOLIN HFA) 108 (90 Base) MCG/ACT inhaler Inhale 2 puffs into the lungs every 4 (four) hours as needed. 54 g 1   allopurinol (ZYLOPRIM) 100 MG tablet TAKE 1 TABLET BY MOUTH DAILY. 90 tablet 1   colchicine 0.6 MG tablet Take 1 tablet (0.6 mg total) by mouth 2 (two) times daily as needed (for gout flare). 30 tablet 3   fluticasone (FLONASE) 50 MCG/ACT nasal spray PLACE 2 SPRAYS INTO BOTH NOSTRILS DAILY. 48 g 3   No current facility-administered medications on file prior to visit.    Review of Systems  Constitutional:  Negative for activity change, appetite change, fatigue, fever and unexpected weight change.  HENT:  Negative for congestion, rhinorrhea, sore throat and trouble swallowing.   Eyes:  Negative for pain, redness, itching and visual disturbance.  Respiratory:  Negative for cough, chest tightness, shortness of breath and wheezing.   Cardiovascular:  Negative for chest pain and palpitations.  Gastrointestinal:  Negative for abdominal pain, blood in stool, constipation, diarrhea and nausea.  Endocrine: Negative for cold intolerance, heat intolerance, polydipsia and polyuria.  Genitourinary:  Negative for difficulty urinating, dysuria, frequency and  urgency.  Musculoskeletal:  Negative for arthralgias, joint swelling and myalgias.  Skin:  Negative for pallor and rash.  Neurological:  Negative for dizziness, tremors, weakness, numbness and headaches.  Hematological:  Negative for adenopathy. Does not bruise/bleed easily.  Psychiatric/Behavioral:  Negative for decreased concentration and dysphoric mood. The patient  is nervous/anxious.        Mood is stable      Objective:   Physical Exam Constitutional:      General: He is not in acute distress.    Appearance: Normal appearance. He is well-developed. He is obese. He is not ill-appearing or diaphoretic.  HENT:     Head: Normocephalic and atraumatic.     Right Ear: Tympanic membrane, ear canal and external ear normal.     Left Ear: Tympanic membrane, ear canal and external ear normal.     Nose: Nose normal. No congestion.     Mouth/Throat:     Mouth: Mucous membranes are moist.     Pharynx: Oropharynx is clear. No posterior oropharyngeal erythema.  Eyes:     General: No scleral icterus.       Right eye: No discharge.        Left eye: No discharge.     Conjunctiva/sclera: Conjunctivae normal.     Pupils: Pupils are equal, round, and reactive to light.  Neck:     Thyroid: No thyromegaly.     Vascular: No carotid bruit or JVD.  Cardiovascular:     Rate and Rhythm: Normal rate and regular rhythm.     Pulses: Normal pulses.     Heart sounds: Normal heart sounds.    No gallop.  Pulmonary:     Effort: Pulmonary effort is normal. No respiratory distress.     Breath sounds: Normal breath sounds. No wheezing or rales.     Comments: Good air exch Chest:     Chest wall: No tenderness.  Abdominal:     General: Bowel sounds are normal. There is no distension or abdominal bruit.     Palpations: Abdomen is soft. There is no mass.     Tenderness: There is no abdominal tenderness.     Hernia: No hernia is present.  Musculoskeletal:        General: No tenderness.     Cervical back:  Normal range of motion and neck supple. No rigidity. No muscular tenderness.     Right lower leg: No edema.     Left lower leg: No edema.     Comments: No kyphosis or acute joint changes  Lymphadenopathy:     Cervical: No cervical adenopathy.  Skin:    General: Skin is warm and dry.     Coloration: Skin is not pale.     Findings: No erythema or rash.     Comments: Solar lentigines diffusely   Neurological:     Mental Status: He is alert.     Cranial Nerves: No cranial nerve deficit.     Motor: No abnormal muscle tone.     Coordination: Coordination normal.     Gait: Gait normal.     Deep Tendon Reflexes: Reflexes are normal and symmetric. Reflexes normal.  Psychiatric:        Mood and Affect: Mood normal.        Cognition and Memory: Cognition and memory normal.          Assessment & Plan:   Problem List Items Addressed This Visit       Cardiovascular and Mediastinum   Essential hypertension    bp in fair control at this time  BP Readings from Last 1 Encounters:  11/06/21 130/82  No changes needed Most recent labs reviewed  Disc lifstyle change with low sodium diet and exercise  Plan to continue hctz 25 mg daily  Labs today  Relevant Medications   hydrochlorothiazide (HYDRODIURIL) 25 MG tablet   Other Relevant Orders   CBC with Differential/Platelet (Completed)   Comprehensive metabolic panel (Completed)   TSH (Completed)   Lipid panel (Completed)     Other   Morbid obesity ([place])    Discussed how this problem influences overall health and the risks it imposes  Reviewed plan for weight loss with lower calorie diet (via better food choices and also portion control or program like weight watchers) and exercise building up to or more than 30 minutes 5 days per week including some aerobic activity   Commended with wt loss so far, enc to keep it up with healthy diet and exercise       Gout    No recent flares Controlled with allopurinol and diet  Labs  today      Relevant Orders   Uric acid (Completed)   Anxiety and depression    Reviewed PHQ score of 8 Overall pt thinks he is doing well and mood does not affect his day to day activities Reviewed stressors/ coping techniques/symptoms/ support sources/ tx options and side effects in detail today  Thinks anxiety is a bigger problem than depression  No SI Plan to continue zoloft 100 mg daily  Tolerates well  Encouraged good self care      Relevant Medications   sertraline (ZOLOFT) 100 MG tablet   Routine general medical examination at a health care facility - Primary    Reviewed health habits including diet and exercise and skin cancer prevention Reviewed appropriate screening tests for age  Also reviewed health mt list, fam hx and immunization status , as well as social and family history   See HPI Labs ordered Enc further wt loss  Declines covid vaccination  Flu shot given Tdap vaccine given Reviewed options for colon cancer screen incl colonoscopy and ifob and cologuard He is unsure what he will choose -plans to call back and voiced understanding  No prostate concerns  May have interest in testosterone level check in the future      Relevant Orders   Flu Vaccine QUAD 6+ mos PF IM (Fluarix Quad PF) (Completed)   Tdap vaccine greater than or equal to 7yo IM (Completed)   Low HDL (under 40)    Lipid panel today  Disc goals for lipids and reasons to control them Rev last labs with pt Rev low sat fat diet in detail Exercise and diet have improved       Prediabetes    a1c today  Diet has been better and wt loss noted  disc imp of low glycemic diet and wt loss to prevent DM2       Relevant Orders   Hemoglobin A1c (Completed)   Colon cancer screening    D/w patient [id number] for colon cancer screening, including IFOB vs. colonoscopy.  Risks and benefits of both were discussed and patient voiced understanding.  Pt plans to think about it and call back with choice         Other Visit Diagnoses     Need for Tdap vaccination       Relevant Orders   Tdap vaccine greater than or equal to 7yo IM (Completed)   Need for influenza vaccination       Relevant Orders   Flu Vaccine QUAD 6+ mos PF IM (Fluarix Quad PF) (Completed)

## 2021-12-17 ENCOUNTER — Other Ambulatory Visit: Payer: Self-pay | Admitting: Family Medicine

## 2022-01-19 ENCOUNTER — Other Ambulatory Visit: Payer: Self-pay | Admitting: Family Medicine

## 2022-01-20 ENCOUNTER — Telehealth: Payer: Self-pay | Admitting: Family Medicine

## 2022-01-20 NOTE — Telephone Encounter (Signed)
Patient called requesting refills on the following, as he states [facility] did not have approval to fill @ this time:  1.Medication Requested:  lansoprazole (PREVACID) 30 MG capsule [[id number]]  2. Pharmacy (Name, Street, [place]): [facility] [phone] - [place], [place] - [address] AT [address]  [address], [place]    3. On Med List:  Yes  4. Last Visit with PCP:  11/19/21  5. Next visit date with PCP:  None   Agent: Please be advised that RX refills may take up to 3 business days. We ask that you follow-up with your pharmacy.

## 2022-01-20 NOTE — Telephone Encounter (Signed)
We refilled med for a year on 11/06/21, called pharmacy and they do have the refills on file, they will get it ready now

## 2022-06-23 ENCOUNTER — Other Ambulatory Visit (HOSPITAL_COMMUNITY): Payer: Self-pay

## 2022-10-09 ENCOUNTER — Other Ambulatory Visit: Payer: Self-pay | Admitting: Family Medicine

## 2023-01-06 ENCOUNTER — Other Ambulatory Visit: Payer: Self-pay | Admitting: Family Medicine

## 2023-01-06 NOTE — Telephone Encounter (Signed)
Patient has been scheduled

## 2023-01-06 NOTE — Telephone Encounter (Signed)
Last CPE was 11/06/21, please schedule pt a CPE and then route to me to refill meds

## 2023-01-12 ENCOUNTER — Ambulatory Visit (INDEPENDENT_AMBULATORY_CARE_PROVIDER_SITE_OTHER): Payer: No Typology Code available for payment source | Admitting: Family Medicine

## 2023-01-12 ENCOUNTER — Encounter: Payer: Self-pay | Admitting: Family Medicine

## 2023-01-12 VITALS — BP 134/81 | HR 67 | Temp 97.7°F | Ht 71.5 in | Wt 302.2 lb

## 2023-01-12 DIAGNOSIS — M109 Gout, unspecified: Secondary | ICD-10-CM

## 2023-01-12 DIAGNOSIS — F419 Anxiety disorder, unspecified: Secondary | ICD-10-CM | POA: Diagnosis not present

## 2023-01-12 DIAGNOSIS — Z0000 Encounter for general adult medical examination without abnormal findings: Secondary | ICD-10-CM | POA: Diagnosis not present

## 2023-01-12 DIAGNOSIS — E786 Lipoprotein deficiency: Secondary | ICD-10-CM

## 2023-01-12 DIAGNOSIS — K219 Gastro-esophageal reflux disease without esophagitis: Secondary | ICD-10-CM

## 2023-01-12 DIAGNOSIS — Z1211 Encounter for screening for malignant neoplasm of colon: Secondary | ICD-10-CM

## 2023-01-12 DIAGNOSIS — I10 Essential (primary) hypertension: Secondary | ICD-10-CM

## 2023-01-12 DIAGNOSIS — Z79899 Other long term (current) drug therapy: Secondary | ICD-10-CM | POA: Insufficient documentation

## 2023-01-12 DIAGNOSIS — R7303 Prediabetes: Secondary | ICD-10-CM | POA: Diagnosis not present

## 2023-01-12 DIAGNOSIS — F32A Depression, unspecified: Secondary | ICD-10-CM

## 2023-01-12 NOTE — Assessment & Plan Note (Signed)
A1c ordered On hctz disc imp of low glycemic diet and wt loss to prevent DM2

## 2023-01-12 NOTE — Assessment & Plan Note (Signed)
Disc goals for lipids and reasons to control them Rev last labs with pt Rev low sat fat diet in detail  Labs today   Enc to continue exercise

## 2023-01-12 NOTE — Assessment & Plan Note (Signed)
bp in fair control at this time  BP Readings from Last 1 Encounters:  01/12/23 134/81   No changes needed Most recent labs reviewed  Disc lifstyle change with low sodium diet and exercise  Plan to continue hctz 25 mg daily  Labs today

## 2023-01-12 NOTE — Assessment & Plan Note (Signed)
Pt is ready to do first screening colonoscopy  GI referral done to [facility]   He will call to schedule that

## 2023-01-12 NOTE — Assessment & Plan Note (Signed)
Takes generic prevacid and trying to self wean slowly  Down to 3 pills weekly on average  Enc him to watch diet as well

## 2023-01-12 NOTE — Progress Notes (Signed)
Subjective:    Patient ID: Samuel Murray, male    DOB: [birth year], 47 y.o.   MRN: [id number]  HPI Here for health maintenance exam and to review chronic medical problems    Wt Readings from Last 3 Encounters:  01/12/23 (!) 302 lb 4 oz (137.1 kg)  11/06/21 290 lb (131.5 kg)  11/19/20 (!) 319 lb 7 oz (144.9 kg)   41.57 kg/m  Just found out his uncle died on the way here   Feeling ok in general  Trying to take care of himself  His wife had shoulder surgery - he had to take care of her for a while  That is getting better   (he was unable to go to the gym for a while)     Immunization History  Administered Date(s) Administered   Influenza Split 09/12/2011   Influenza,inj,Quad PF,6+ Mos 12/06/2015, 11/09/2016, 09/26/2019, 10/11/2020, 11/06/2021   Influenza-Unspecified 10/04/2017, 10/17/2018, 10/26/2022   Td 02/27/2010   Tdap 11/06/2021   Health Maintenance Due  Topic Date Due   COLONOSCOPY (Pts 45-53yrs Insurance coverage will need to be confirmed)  Never done    Colon cancer screening : wants to go ahead and schedule a colonoscopy  [place]   Prostate health: no problems  No nocturia at all   Mood Has been fair overall    01/12/2023    3:12 PM 11/06/2021    9:59 AM 11/19/2020    4:37 PM 09/26/2019    3:28 PM 05/30/2018    3:53 PM  Depression screen PHQ 2/9  Decreased Interest 0 1 1 0 0  Down, Depressed, Hopeless 1 1 1  0 0  PHQ - 2 Score 1 2 2  0 0  Altered sleeping 0 1 2 0   Tired, decreased energy 0 0 2 0   Change in appetite 1 1 2  0   Feeling bad or failure about yourself  0 1 1 0   Trouble concentrating 0 1 1 0   Moving slowly or fidgety/restless 0 2 0 0   Suicidal thoughts 0 0 0 0   PHQ-9 Score 2 8 10  0   Difficult doing work/chores Not difficult at all Not difficult at all Somewhat difficult Not difficult at all     Takes zoloft 100 mg daily for dep/anx Still struggles with anxiety more than depression   Not doing any therapy  Goes to the  gym for 90 minutes every day       HTN bp is stable today  No cp or palpitations or headaches or edema  No side effects to medicines  BP Readings from Last 3 Encounters:  01/12/23 134/81  11/06/21 130/82  11/19/20 131/88    Hctz 25 mg daily   GERD Takes prevacid- trying to wean don on that  Going very slow and down to 3 per week now   Diet is pretty good  Some cheating over the holidays but not too bad   Does not drink alcohol  No drinks with sugar at all  Occ diet drinks   Niece in nursing school moved in She is a good influence with diet      Lab Results  Component Value Date   CREATININE 1.07 11/06/2021   BUN 26 (H) 11/06/2021   NA 141 11/06/2021   K 4.4 11/06/2021   CL 102 11/06/2021   CO2 32 11/06/2021   H/o gout  Takes allopurinol 100 ng daily  Fluid intake   Prediabetes Lab Results  Component Value Date   HGBA1C 5.9 11/06/2021    Hyperlipidemia Lab Results  Component Value Date   CHOL 159 11/06/2021   HDL 31.70 (L) 11/06/2021   LDLCALC 104 (H) 11/06/2021   LDLDIRECT 132.0 02/11/2010   TRIG 117.0 11/06/2021   CHOLHDL 5 11/06/2021   Due for labs  Diet is not too bad overall   Mostly chicken breast for protein    Patient Active Problem List   Diagnosis Date Noted   Current use of proton pump inhibitor 01/12/2023   Colon cancer screening 11/06/2021   Prediabetes 09/20/2019   Foot pain, bilateral 05/19/2018   DDD (degenerative disc disease), lumbar 05/12/2017   Low HDL (under 40) 04/22/2016   Routine general medical examination at a health care facility 12/06/2015   Anxiety and depression 03/13/2015   Essential hypertension 03/13/2015   Gout 03/06/2015   GERD (gastroesophageal reflux disease) 02/05/2013   Morbid obesity ([place]) 03/01/2008   ADD 02/01/2008   ALLERGIC RHINITIS 02/01/2008   ASTHMA 02/01/2008   Past Medical History:  Diagnosis Date   Allergic rhinitis    Asthma    Attention deficit disorder without mention of  hyperactivity    Depression    Elevated blood pressure reading without diagnosis of hypertension    Obesity, unspecified    Past Surgical History:  Procedure Laterality Date   ANKLE SURGERY     TONSILLECTOMY AND ADENOIDECTOMY     Social History   Tobacco Use   Smoking status: Former    Types: Cigarettes    Quit date: 12/28/2000    Years since quitting: 22.0   Smokeless tobacco: Never  Substance Use Topics   Alcohol use: No    Alcohol/week: 0.0 standard drinks of alcohol   Drug use: No   Family History  Problem Relation Age of Onset   Hypertension Father    Other Father        heart problems   Heart disease Father    Hypothyroidism Sister    Thyroid disease Sister        hypothyroid   Allergies  Allergen Reactions   Escitalopram Oxalate     REACTION: sedation   Prozac [Fluoxetine Hcl]     Wt gain    Current Outpatient Medications on File Prior to Visit  Medication Sig Dispense Refill   albuterol (VENTOLIN HFA) 108 (90 Base) MCG/ACT inhaler Inhale 2 puffs into the lungs every 4 (four) hours as needed. 54 g 1   allopurinol (ZYLOPRIM) 100 MG tablet TAKE 1 TABLET(100 MG) BY MOUTH DAILY 90 tablet 0   colchicine 0.6 MG tablet Take 1 tablet (0.6 mg total) by mouth 2 (two) times daily as needed (for gout flare). 30 tablet 3   fluticasone (FLONASE) 50 MCG/ACT nasal spray USE 2 SPRAYS IN EACH NOSTRIL EVERY DAY 48 g 3   hydrochlorothiazide (HYDRODIURIL) 25 MG tablet Take 1 tablet (25 mg total) by mouth daily. 90 tablet 0   lansoprazole (PREVACID) 30 MG capsule Take 1 capsule (30 mg total) by mouth daily. 90 capsule 0   sertraline (ZOLOFT) 100 MG tablet Take 1 tablet (100 mg total) by mouth daily. 90 tablet 0   No current facility-administered medications on file prior to visit.    Review of Systems  Constitutional:  Negative for activity change, appetite change, fatigue, fever and unexpected weight change.  HENT:  Negative for congestion, rhinorrhea, sore throat and trouble  swallowing.   Eyes:  Negative for pain, redness, itching and visual disturbance.  Respiratory:  Negative for cough, chest tightness, shortness of breath and wheezing.   Cardiovascular:  Negative for chest pain and palpitations.  Gastrointestinal:  Negative for abdominal pain, blood in stool, constipation, diarrhea and nausea.  Endocrine: Negative for cold intolerance, heat intolerance, polydipsia and polyuria.  Genitourinary:  Negative for difficulty urinating, dysuria, frequency and urgency.  Musculoskeletal:  Negative for arthralgias, joint swelling and myalgias.  Skin:  Negative for pallor and rash.  Neurological:  Negative for dizziness, tremors, weakness, numbness and headaches.  Hematological:  Negative for adenopathy. Does not bruise/bleed easily.  Psychiatric/Behavioral:  Negative for decreased concentration, dysphoric mood and sleep disturbance. The patient is nervous/anxious.        Objective:   Physical Exam Constitutional:      General: He is not in acute distress.    Appearance: Normal appearance. He is well-developed. He is obese. He is not ill-appearing or diaphoretic.     Comments: Muscular frame  Bmi may not adequately represent degree of obesity  HENT:     Head: Normocephalic and atraumatic.     Right Ear: Tympanic membrane, ear canal and external ear normal.     Left Ear: Tympanic membrane, ear canal and external ear normal.     Nose: Nose normal. No congestion.     Mouth/Throat:     Mouth: Mucous membranes are moist.     Pharynx: Oropharynx is clear. No posterior oropharyngeal erythema.  Eyes:     General: No scleral icterus.       Right eye: No discharge.        Left eye: No discharge.     Conjunctiva/sclera: Conjunctivae normal.     Pupils: Pupils are equal, round, and reactive to light.  Neck:     Thyroid: No thyromegaly.     Vascular: No carotid bruit or JVD.  Cardiovascular:     Rate and Rhythm: Normal rate and regular rhythm.     Pulses: Normal  pulses.     Heart sounds: Normal heart sounds.     No gallop.  Pulmonary:     Effort: Pulmonary effort is normal. No respiratory distress.     Breath sounds: Normal breath sounds. No wheezing or rales.     Comments: Good air exch Chest:     Chest wall: No tenderness.  Abdominal:     General: Bowel sounds are normal. There is no distension or abdominal bruit.     Palpations: Abdomen is soft. There is no mass.     Tenderness: There is no abdominal tenderness.     Hernia: No hernia is present.  Musculoskeletal:        General: No tenderness.     Cervical back: Normal range of motion and neck supple. No rigidity. No muscular tenderness.     Right lower leg: No edema.     Left lower leg: No edema.  Lymphadenopathy:     Cervical: No cervical adenopathy.  Skin:    General: Skin is warm and dry.     Coloration: Skin is not pale.     Findings: No erythema or rash.     Comments: Solar lentigines diffusely Some stable brown moles of different sizes on back and trunk  Neurological:     Mental Status: He is alert.     Cranial Nerves: No cranial nerve deficit.     Motor: No abnormal muscle tone.     Coordination: Coordination normal.     Gait: Gait normal.     Deep  Tendon Reflexes: Reflexes are normal and symmetric. Reflexes normal.  Psychiatric:        Attention and Perception: Attention normal.        Mood and Affect: Mood normal.        Cognition and Memory: Cognition and memory normal.     Comments: Candidly discusses symptoms and stressors             Assessment & Plan:   Problem List Items Addressed This Visit       Cardiovascular and Mediastinum   Essential hypertension    bp in fair control at this time  BP Readings from Last 1 Encounters:  01/12/23 134/81  No changes needed Most recent labs reviewed  Disc lifstyle change with low sodium diet and exercise  Plan to continue hctz 25 mg daily  Labs today      Relevant Orders   TSH   Lipid panel    Comprehensive metabolic panel   CBC with Differential/Platelet     Digestive   GERD (gastroesophageal reflux disease)    Takes generic prevacid and trying to self wean slowly  Down to 3 pills weekly on average  Enc him to watch diet as well        Other   Anxiety and depression    Taking zoloft 100 mg daily  Still struggles with anxiety at times Recently had to take days off from gym to care for his wife (now he can return and this is his coping mechanism) Reviewed stressors/ coping techniques/symptoms/ support sources/ tx options and side effects in detail today Enc good self care  Enc counseling in future if open to it         Colon cancer screening    Pt is ready to do first screening colonoscopy  GI referral done to [facility]   He will call to schedule that       Relevant Orders   Ambulatory referral to Gastroenterology   Current use of proton pump inhibitor    B12 added to labs Takes prevacid and trying to wean       Relevant Orders   Vitamin B12   Gout    No recent flares On allopurinol  Uric acid added to labs  Enc to watch diet for high purine foods       Relevant Orders   Uric acid   Low HDL (under 40)    Disc goals for lipids and reasons to control them Rev last labs with pt Rev low sat fat diet in detail  Labs today   Enc to continue exercise         Morbid obesity (HCC)    Discussed how this problem influences overall health and the risks it imposes  Reviewed plan for weight loss with lower calorie diet (via better food choices and also portion control or program like weight watchers) and exercise building up to or more than 30 minutes 5 days per week including some aerobic activity   Enc low glycemic diet Continue good exercise with wt training   Pt has a large amt of muscle mass and his bmi may not accurately represent his wt category      Prediabetes    A1c ordered On hctz disc imp of low glycemic diet and wt loss to  prevent DM2       Relevant Orders   Hemoglobin A1c   Routine general medical examination at a health care facility - Primary  Reviewed health habits including diet and exercise and skin cancer prevention Reviewed appropriate screening tests for age  Also reviewed health mt list, fam hx and immunization status , as well as social and family history   See HPI Labs ordered   GI referral done to schedule colonoscopy  No prostate symptoms  Stable mood  Good exercise habits  Enc go keep up good self care  Working on wt loss

## 2023-01-12 NOTE — Assessment & Plan Note (Addendum)
Discussed how this problem influences overall health and the risks it imposes  Reviewed plan for weight loss with lower calorie diet (via better food choices and also portion control or program like weight watchers) and exercise building up to or more than 30 minutes 5 days per week including some aerobic activity   Enc low glycemic diet Continue good exercise with wt training   Pt has a large amt of muscle mass and his bmi may not accurately represent his wt category

## 2023-01-12 NOTE — Assessment & Plan Note (Signed)
B12 added to labs Takes prevacid and trying to wean

## 2023-01-12 NOTE — Assessment & Plan Note (Signed)
Reviewed health habits including diet and exercise and skin cancer prevention Reviewed appropriate screening tests for age  Also reviewed health mt list, fam hx and immunization status , as well as social and family history   See HPI Labs ordered   GI referral done to schedule colonoscopy  No prostate symptoms  Stable mood  Good exercise habits  Enc go keep up good self care  Working on wt loss

## 2023-01-12 NOTE — Patient Instructions (Addendum)
Take care of yourself   Keep working on healthy diet and exercise   Call [facility] so schedule your colonoscopy  If you have any problems let us know   [facility]  [phone]   To prevent diabetes and control weight  Try to get most of your carbohydrates from produce (with the exception of white potatoes)  Eat less bread/pasta/rice/snack foods/cereals/sweets and other items from the middle of the grocery store (processed carbs)   Lab today

## 2023-01-12 NOTE — Assessment & Plan Note (Signed)
No recent flares On allopurinol  Uric acid added to labs  Enc to watch diet for high purine foods

## 2023-01-12 NOTE — Assessment & Plan Note (Signed)
Taking zoloft 100 mg daily  Still struggles with anxiety at times Recently had to take days off from gym to care for his wife (now he can return and this is his coping mechanism) Reviewed stressors/ coping techniques/symptoms/ support sources/ tx options and side effects in detail today Enc good self care  Enc counseling in future if open to it

## 2023-01-13 ENCOUNTER — Telehealth: Payer: Self-pay | Admitting: *Deleted

## 2023-01-13 ENCOUNTER — Other Ambulatory Visit: Payer: Self-pay | Admitting: *Deleted

## 2023-01-13 DIAGNOSIS — Z1211 Encounter for screening for malignant neoplasm of colon: Secondary | ICD-10-CM

## 2023-01-13 LAB — LIPID PANEL
Cholesterol: 174 mg/dL (ref 0–200)
HDL: 30.6 mg/dL — ABNORMAL LOW (ref >39.00)
NonHDL: 142.94
Total CHOL/HDL Ratio: 6
Triglycerides: 209 mg/dL — ABNORMAL HIGH (ref 0.0–149.0)
VLDL: 41.8 mg/dL — ABNORMAL HIGH (ref 0.0–40.0)

## 2023-01-13 LAB — COMPREHENSIVE METABOLIC PANEL
ALT: 28 U/L (ref 0–53)
AST: 24 U/L (ref 0–37)
Albumin: 4.8 g/dL (ref 3.5–5.2)
Alkaline Phosphatase: 79 U/L (ref 39–117)
BUN: 24 mg/dL — ABNORMAL HIGH (ref 6–23)
CO2: 30 mEq/L (ref 19–32)
Calcium: 9.1 mg/dL (ref 8.4–10.5)
Chloride: 101 mEq/L (ref 96–112)
Creatinine, Ser: 1.08 mg/dL (ref 0.40–1.50)
GFR: 81.14 mL/min (ref >60.00)
Glucose, Bld: 91 mg/dL (ref 70–99)
Potassium: 4.2 mEq/L (ref 3.5–5.1)
Sodium: 139 mEq/L (ref 135–145)
Total Bilirubin: 0.8 mg/dL (ref 0.2–1.2)
Total Protein: 7.3 g/dL (ref 6.0–8.3)

## 2023-01-13 LAB — CBC WITH DIFFERENTIAL/PLATELET
Basophils Absolute: 0 10*3/uL (ref 0.0–0.1)
Basophils Relative: 0.6 % (ref 0.0–3.0)
Eosinophils Absolute: 0.2 10*3/uL (ref 0.0–0.7)
Eosinophils Relative: 2.4 % (ref 0.0–5.0)
HCT: 44 % (ref 39.0–52.0)
Hemoglobin: 15.3 g/dL (ref 13.0–17.0)
Lymphocytes Relative: 37.6 % (ref 12.0–46.0)
Lymphs Abs: 2.4 10*3/uL (ref 0.7–4.0)
MCHC: 34.9 g/dL (ref 30.0–36.0)
MCV: 85.3 fl (ref 78.0–100.0)
Monocytes Absolute: 0.5 10*3/uL (ref 0.1–1.0)
Monocytes Relative: 8.2 % (ref 3.0–12.0)
Neutro Abs: 3.2 10*3/uL (ref 1.4–7.7)
Neutrophils Relative %: 51.2 % (ref 43.0–77.0)
Platelets: 198 10*3/uL (ref 150.0–400.0)
RBC: 5.16 Mil/uL (ref 4.22–5.81)
RDW: 13.5 % (ref 11.5–15.5)
WBC: 6.3 10*3/uL (ref 4.0–10.5)

## 2023-01-13 LAB — URIC ACID: Uric Acid, Serum: 6.7 mg/dL (ref 4.0–7.8)

## 2023-01-13 LAB — VITAMIN B12: Vitamin B-12: 960 pg/mL — ABNORMAL HIGH (ref 211–911)

## 2023-01-13 LAB — TSH: TSH: 1.46 u[IU]/mL (ref 0.35–5.50)

## 2023-01-13 LAB — LDL CHOLESTEROL, DIRECT: Direct LDL: 125 mg/dL

## 2023-01-13 LAB — HEMOGLOBIN A1C: Hgb A1c MFr Bld: 6.1 % (ref 4.6–6.5)

## 2023-01-13 MED ORDER — NA SULFATE-K SULFATE-MG SULF 17.5-3.13-1.6 GM/177ML PO SOLN: 1.0000 | Freq: Once | ORAL | 0 refills | Status: AC

## 2023-01-13 NOTE — Telephone Encounter (Signed)
Gastroenterology Pre-Procedure Review  Request Date: 04/16/2023 Requesting Physician: Dr. Vicente Males  PATIENT REVIEW QUESTIONS: The patient responded to the following health history questions as indicated:    1. Are you having any GI issues? no 2. Do you have a personal history of Polyps? no 3. Do you have a family history of Colon Cancer or Polyps? no 4. Diabetes Mellitus? no 5. Joint replacements in the past 12 months?no 6. Major health problems in the past 3 months?no 7. Any artificial heart valves, MVP, or defibrillator?no    MEDICATIONS & ALLERGIES:    Patient reports the following regarding taking any anticoagulation/antiplatelet therapy:   Plavix, Coumadin, Eliquis, Xarelto, Lovenox, Pradaxa, Brilinta, or Effient? no Aspirin? no  Patient confirms/reports the following medications:  Current Outpatient Medications  Medication Sig Dispense Refill   albuterol (VENTOLIN HFA) 108 (90 Base) MCG/ACT inhaler Inhale 2 puffs into the lungs every 4 (four) hours as needed. 54 g 1   allopurinol (ZYLOPRIM) 100 MG tablet TAKE 1 TABLET(100 MG) BY MOUTH DAILY 90 tablet 0   colchicine 0.6 MG tablet Take 1 tablet (0.6 mg total) by mouth 2 (two) times daily as needed (for gout flare). 30 tablet 3   fluticasone (FLONASE) 50 MCG/ACT nasal spray USE 2 SPRAYS IN EACH NOSTRIL EVERY DAY 48 g 3   hydrochlorothiazide (HYDRODIURIL) 25 MG tablet Take 1 tablet (25 mg total) by mouth daily. 90 tablet 0   lansoprazole (PREVACID) 30 MG capsule Take 1 capsule (30 mg total) by mouth daily. 90 capsule 0   sertraline (ZOLOFT) 100 MG tablet Take 1 tablet (100 mg total) by mouth daily. 90 tablet 0   No current facility-administered medications for this visit.    Patient confirms/reports the following allergies:  Allergies  Allergen Reactions   Escitalopram Oxalate     REACTION: sedation   Prozac [Fluoxetine Hcl]     Wt gain     No orders of the defined types were placed in this encounter.   AUTHORIZATION  INFORMATION Primary Insurance: 1D#: Group #:  Secondary Insurance: 1D#: Group #:  SCHEDULE INFORMATION: Date: 04/16/2023 Time: Location: [facility]

## 2023-04-04 ENCOUNTER — Other Ambulatory Visit: Payer: Self-pay | Admitting: Family Medicine

## 2023-04-09 ENCOUNTER — Encounter: Payer: Self-pay | Admitting: Gastroenterology

## 2023-04-15 ENCOUNTER — Encounter: Payer: Self-pay | Admitting: Gastroenterology

## 2023-04-16 ENCOUNTER — Encounter: Payer: Self-pay | Admitting: Gastroenterology

## 2023-04-16 ENCOUNTER — Other Ambulatory Visit: Payer: Self-pay

## 2023-04-16 ENCOUNTER — Encounter
Admission: RE | Disposition: A | Discharge status: Home / Self Care | Payer: Self-pay | Source: Home / Self Care | Attending: Gastroenterology

## 2023-04-16 ENCOUNTER — Ambulatory Visit
Admission: RE | Admit: 2023-04-16 | Discharge: 2023-04-16 | Disposition: A | Discharge status: Home / Self Care | Payer: 59 | Attending: Gastroenterology | Admitting: Gastroenterology

## 2023-04-16 ENCOUNTER — Ambulatory Visit: Payer: 59 | Admitting: Anesthesiology

## 2023-04-16 DIAGNOSIS — J45909 Unspecified asthma, uncomplicated: Secondary | ICD-10-CM | POA: Diagnosis not present

## 2023-04-16 DIAGNOSIS — I10 Essential (primary) hypertension: Secondary | ICD-10-CM | POA: Diagnosis not present

## 2023-04-16 DIAGNOSIS — F419 Anxiety disorder, unspecified: Secondary | ICD-10-CM | POA: Insufficient documentation

## 2023-04-16 DIAGNOSIS — D125 Benign neoplasm of sigmoid colon: Secondary | ICD-10-CM | POA: Diagnosis not present

## 2023-04-16 DIAGNOSIS — Z1211 Encounter for screening for malignant neoplasm of colon: Secondary | ICD-10-CM | POA: Diagnosis not present

## 2023-04-16 DIAGNOSIS — D120 Benign neoplasm of cecum: Secondary | ICD-10-CM | POA: Insufficient documentation

## 2023-04-16 DIAGNOSIS — F32A Depression, unspecified: Secondary | ICD-10-CM | POA: Diagnosis not present

## 2023-04-16 DIAGNOSIS — D126 Benign neoplasm of colon, unspecified: Secondary | ICD-10-CM

## 2023-04-16 DIAGNOSIS — Z6838 Body mass index (BMI) 38.0-38.9, adult: Secondary | ICD-10-CM | POA: Diagnosis not present

## 2023-04-16 DIAGNOSIS — E669 Obesity, unspecified: Secondary | ICD-10-CM | POA: Diagnosis not present

## 2023-04-16 DIAGNOSIS — Z87891 Personal history of nicotine dependence: Secondary | ICD-10-CM | POA: Insufficient documentation

## 2023-04-16 HISTORY — PX: COLONOSCOPY WITH PROPOFOL: SHX5780

## 2023-04-16 SURGERY — COLONOSCOPY WITH PROPOFOL
Anesthesia: General

## 2023-04-16 MED ORDER — PROPOFOL 500 MG/50ML IV EMUL
INTRAVENOUS | Status: DC | PRN
  Administered 2023-04-16: 150 ug/kg/min via INTRAVENOUS

## 2023-04-16 MED ORDER — LIDOCAINE HCL (CARDIAC) PF 100 MG/5ML IV SOSY
PREFILLED_SYRINGE | INTRAVENOUS | Status: DC | PRN
  Administered 2023-04-16: 50 mg via INTRAVENOUS

## 2023-04-16 MED ORDER — PROPOFOL 10 MG/ML IV BOLUS
INTRAVENOUS | Status: DC | PRN
  Administered 2023-04-16: 100 mg via INTRAVENOUS

## 2023-04-16 MED ORDER — SODIUM CHLORIDE 0.9 % IV SOLN: INTRAVENOUS | Status: DC

## 2023-04-16 NOTE — Transfer of Care (Signed)
Immediate Anesthesia Transfer of Care Note  Patient: Samuel Murray  Procedure(s) Performed: COLONOSCOPY WITH PROPOFOL  Patient Location: Endoscopy Unit  Anesthesia Type:General  Level of Consciousness: sedated and responds to stimulation  Airway & Oxygen Therapy: Patient Spontanous Breathing  Post-op Assessment: Report given to RN and Post -op Vital signs reviewed and stable  Post vital signs: Reviewed and stable  Last Vitals:  Vitals Value Taken Time  BP 109/76 04/16/23 0944  Temp    Pulse 73 04/16/23 0944  Resp 16 04/16/23 0944  SpO2 94 % 04/16/23 0944  Vitals shown include unvalidated device data.  Last Pain:  Vitals:   04/16/23 0942  TempSrc:   PainSc: 0-No pain         Complications: No notable events documented.

## 2023-04-16 NOTE — Op Note (Signed)
[facility] Patient Name: Samuel Murray Procedure Date: 04/16/2023 9:10 AM MRN: [id number] Account #: [id number] Date of Birth: 08-20-1976 Admit Type: Outpatient Age: 47 Room: [facility] ENDO ROOM 4 Gender: Male Note Status: Finalized Instrument Name: Prentice Docker [id number] Procedure:             Colonoscopy Indications:           Screening for colorectal malignant neoplasm Providers:             Wyline Mood MD, MD Referring MD:          Audrie Gallus. [place] (Referring MD) Medicines:             Monitored Anesthesia Care Complications:         No immediate complications. Procedure:             Pre-Anesthesia Assessment:                        - Prior to the procedure, a History and Physical was                         performed, and patient medications, allergies and                         sensitivities were reviewed. The patient's tolerance                         of previous anesthesia was reviewed.                        - The risks and benefits of the procedure and the                         sedation options and risks were discussed with the                         patient. All questions were answered and informed                         consent was obtained.                        - ASA Grade Assessment: II - A patient with mild                         systemic disease.                        After obtaining informed consent, the colonoscope was                         passed under direct vision. Throughout the procedure,                         the patient's blood pressure, pulse, and oxygen                         saturations were monitored continuously. The                         Colonoscope was introduced  through the anus and                         advanced to the the cecum, identified by the                         appendiceal orifice. The colonoscopy was performed                         with ease. The patient tolerated the procedure well.                          The quality of the bowel preparation was excellent.                         The ileocecal valve, appendiceal orifice, and rectum                         were photographed. Findings:      The perianal and digital rectal examinations were normal.      Two sessile polyps were found in the sigmoid colon and cecum. The polyps       were 3 to 4 mm in size. These polyps were removed with a cold snare.       Resection and retrieval were complete.      The exam was otherwise without abnormality on direct and retroflexion       views. Impression:            - Two 3 to 4 mm polyps in the sigmoid colon and in the                         cecum, removed with a cold snare. Resected and                         retrieved.                        - The examination was otherwise normal on direct and                         retroflexion views. Recommendation:        - Discharge patient to home (with escort).                        - Resume previous diet.                        - Continue present medications.                        - Await pathology results.                        - Repeat colonoscopy for surveillance based on                         pathology results. Procedure Code(s):     --- Professional ---                        [phone],  Colonoscopy, flexible; with removal of                         tumor(s), polyp(s), or other lesion(s) by snare                         technique Diagnosis Code(s):     --- Professional ---                        Z12.11, Encounter for screening for malignant neoplasm                         of colon                        D12.5, Benign neoplasm of sigmoid colon                        D12.0, Benign neoplasm of cecum CPT copyright 2022 [employer] Association. All rights reserved. The codes documented in this report are preliminary and upon coder review may  be revised to meet current compliance requirements. Wyline Mood, MD Wyline Mood MD,  MD 04/16/2023 9:42:54 AM This report has been signed electronically. Number of Addenda: 0 Note Initiated On: 04/16/2023 9:10 AM Scope Withdrawal Time: 0 hours 11 minutes 41 seconds  Total Procedure Duration: 0 hours 14 minutes 17 seconds  Estimated Blood Loss:  Estimated blood loss: none.      [facility]

## 2023-04-16 NOTE — H&P (Signed)
Wyline Mood, MD [address], [address], [place], [place], [address] [address], [place], [place], [address] Phone: [phone]  Fax: [phone]  Primary Care Physician:  Tower, Audrie Gallus, MD   Pre-Procedure History & Physical: HPI:  Samuel Murray is a 47 y.o. male is here for an colonoscopy.   Past Medical History:  Diagnosis Date   Allergic rhinitis    Asthma    Attention deficit disorder 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    Depression    Elevated blood pressure reading without diagnosis of hypertension    Obesity, unspecified     Past Surgical History:  Procedure Laterality Date   ANKLE SURGERY     TONSILLECTOMY AND ADENOIDECTOMY      Prior to Admission medications   Medication Sig Start Date End Date Taking? Authorizing Provider  allopurinol (ZYLOPRIM) 100 MG tablet TAKE 1 TABLET(100 MG) BY MOUTH DAILY 04/05/23  Yes Tower, Marne A, MD  hydrochlorothiazide (HYDRODIURIL) 25 MG tablet TAKE 1 TABLET(25 MG) BY MOUTH DAILY 04/05/23  Yes Tower, Marne A, MD  lansoprazole (PREVACID) 30 MG capsule Take 1 capsule (30 mg total) by mouth daily. 01/06/23 01/06/24 Yes Tower, Audrie Gallus, MD  sertraline (ZOLOFT) 100 MG tablet TAKE 1 TABLET(100 MG) BY MOUTH DAILY 04/05/23  Yes Tower, Audrie Gallus, MD  albuterol (VENTOLIN HFA) 108 (90 Base) MCG/ACT inhaler Inhale 2 puffs into the lungs every 4 (four) hours as needed. Patient not taking: Reported on 04/09/2023 02/27/20   Tower, Audrie Gallus, MD  colchicine 0.6 MG tablet Take 1 tablet (0.6 mg total) by mouth 2 (two) times daily as needed (for gout flare). Patient not taking: Reported on 04/09/2023 05/30/18   Tower, Audrie Gallus, MD  fluticasone [facility]) 50 MCG/ACT nasal spray USE 2 SPRAYS IN [facility] NOSTRIL EVERY DAY Patient not taking: Reported on 04/09/2023 12/17/21   Judy Pimple, MD    Allergies as of 01/14/2023 - Review Complete 01/12/2023  Allergen Reaction Noted   Escitalopram oxalate  02/02/2008   Prozac [fluoxetine hcl]  12/01/2011     Family History  Problem Relation Age of Onset   Hypertension Father    Other Father        heart problems   Heart disease Father    Hypothyroidism Sister    Thyroid disease Sister        hypothyroid    Social History   Socioeconomic History   Marital status: Married    Spouse name: Not on file   Number of children: Not on file   Years of education: Not on file   Highest education level: Not on file  Occupational History    Employer: OTHER    Comment: [employer]  Tobacco Use   Smoking status: Former    Types: Cigarettes    Quit date: 12/28/2000    Years since quitting: 22.3   Smokeless tobacco: Never  Vaping Use   Vaping Use: Never used  Substance and Sexual Activity   Alcohol use: No    Alcohol/week: 0.0 standard drinks of alcohol   Drug use: No   Sexual activity: Not on file  Other Topics Concern   Not on file  Social History Narrative   Married   Social Determinants of Health   Financial Resource Strain: Not on file  Food Insecurity: Not on file  Transportation Needs: Not on file  Physical Activity: Not on file  Stress: Not on file  Social Connections: Not on file  Intimate Partner Violence:  Not on file    Review of Systems: See HPI, otherwise negative ROS  Physical Exam: BP (!) 128/92   Pulse 61   Temp (!) 96.9 F (36.1 C) (Temporal)   Resp 16   Ht  (1.88 m)   Wt (!) 137 kg   SpO2 99%   BMI 38.77 kg/m  General:   Alert,  pleasant and cooperative in NAD Head:  Normocephalic and atraumatic. Neck:  Supple; no masses or thyromegaly. Lungs:  Clear throughout to auscultation, normal respiratory effort.    Heart:  +S1, +S2, Regular rate and rhythm, No edema. Abdomen:  Soft, nontender and nondistended. Normal bowel sounds, without guarding, and without rebound.   Neurologic:  Alert and  oriented x4;  grossly normal neurologically.  Impression/Plan: Samuel Murray is here for an colonoscopy to be performed for Screening colonoscopy average  risk   Risks, benefits, limitations, and alternatives regarding  colonoscopy have been reviewed with the patient.  Questions have been answered.  All parties agreeable.   Wyline Mood, MD  04/16/2023, 9:13 AM

## 2023-04-16 NOTE — Anesthesia Preprocedure Evaluation (Addendum)
Anesthesia Evaluation  Patient identified by MRN, date of birth, ID band Patient awake    Reviewed: Allergy & Precautions, NPO status , Patient's Chart, lab work & pertinent test results  History of Anesthesia Complications Negative for: history of anesthetic complications  Airway Mallampati: II   Neck ROM: Full    Dental   Crowns :   Pulmonary asthma , former smoker (quit 2002)   Pulmonary exam normal breath sounds clear to auscultation       Cardiovascular hypertension, Normal cardiovascular exam Rhythm:Regular Rate:Normal     Neuro/Psych  PSYCHIATRIC DISORDERS (ADHD) Anxiety Depression    negative neurological ROS     GI/Hepatic negative GI ROS,,,  Endo/Other  Obesity; prediabetes  Renal/GU      Musculoskeletal   Abdominal   Peds  Hematology negative hematology ROS (+)   Anesthesia Other Findings   Reproductive/Obstetrics                             Anesthesia Physical Anesthesia Plan  ASA: 2  Anesthesia Plan: General   Post-op Pain Management:    Induction: Intravenous  PONV Risk Score and Plan: 2 and Propofol infusion, TIVA and Treatment may vary due to age or medical condition  Airway Management Planned: Natural Airway  Additional Equipment:   Intra-op Plan:   Post-operative Plan:   Informed Consent: I have reviewed the patients History and Physical, chart, labs and discussed the procedure including the risks, benefits and alternatives for the proposed anesthesia with the patient or authorized representative who has indicated his/her understanding and acceptance.       Plan Discussed with: CRNA  Anesthesia Plan Comments: (LMA/GETA backup discussed.  Patient consented for risks of anesthesia including but not limited to:  - adverse reactions to medications - damage to eyes, teeth, lips or other oral mucosa - nerve damage due to positioning  - sore throat or  hoarseness - damage to heart, brain, nerves, lungs, other parts of body or loss of life  Informed patient about role of CRNA in peri- and intra-operative care.  Patient voiced understanding.)       Anesthesia Quick Evaluation

## 2023-04-16 NOTE — Anesthesia Postprocedure Evaluation (Signed)
Anesthesia Post Note  Patient: Samuel Murray  Procedure(s) Performed: COLONOSCOPY WITH PROPOFOL  Patient location during evaluation: PACU Anesthesia Type: General Level of consciousness: awake and alert, oriented and patient cooperative Pain management: pain level controlled Vital Signs Assessment: post-procedure vital signs reviewed and stable Respiratory status: spontaneous breathing, nonlabored ventilation and respiratory function stable Cardiovascular status: blood pressure returned to baseline and stable Postop Assessment: adequate PO intake Anesthetic complications: no   No notable events documented.   Last Vitals:  Vitals:   04/16/23 0952 04/16/23 1002  BP: 118/70 (!) 110/92  Pulse:    Resp:    Temp:  (!) 35.7 C  SpO2:      Last Pain:  Vitals:   04/16/23 1002  TempSrc: Temporal  PainSc: 0-No pain                 Reed Breech

## 2023-04-16 NOTE — Anesthesia Procedure Notes (Signed)
Date/Time: 04/16/2023 9:23 AM  Performed by: Ginger Carne, CRNAPre-anesthesia Checklist: Patient identified, Emergency Drugs available, Suction available, Patient being monitored and Timeout performed Patient Re-evaluated:Patient Re-evaluated prior to induction Oxygen Delivery Method: Nasal cannula Preoxygenation: Pre-oxygenation with 100% oxygen Induction Type: IV induction

## 2023-04-19 ENCOUNTER — Encounter: Payer: Self-pay | Admitting: Gastroenterology

## 2023-04-19 LAB — SURGICAL PATHOLOGY

## 2023-08-18 ENCOUNTER — Other Ambulatory Visit: Payer: Self-pay | Admitting: *Deleted

## 2023-08-18 MED ORDER — LANSOPRAZOLE 30 MG PO CPDR: 30.0000 mg | DELAYED_RELEASE_CAPSULE | Freq: Every day | ORAL | 1 refills | Status: DC

## 2023-10-08 ENCOUNTER — Other Ambulatory Visit: Payer: Self-pay | Admitting: Family Medicine

## 2024-02-28 ENCOUNTER — Other Ambulatory Visit: Payer: Self-pay | Admitting: Family Medicine

## 2024-02-28 NOTE — Telephone Encounter (Unsigned)
 Copied from CRM [id number]. Topic: Clinical - Medication Refill >> Feb 28, 2024 12:33 PM Tiffany H wrote: Most Recent Primary Care Visit:  Provider: Roxy Manns A  Department: [facility]  Visit Type: PHYSICAL  Date: 01/12/2023  Medication: allopurinol (ZYLOPRIM) 100 MG tablet [[id number]] sertraline (ZOLOFT) 100 MG tablet [[id number]] hydrochlorothiazide (HYDRODIURIL) 25 MG tablet [[id number]]  Has the patient contacted their pharmacy? Yes (Agent: If no, request that the patient contact the pharmacy for the refill. If patient does not wish to contact the pharmacy document the reason why and proceed with request.) (Agent: If yes, when and what did the pharmacy advise?)  Is this the correct pharmacy for this prescription? Yes If no, delete pharmacy and type the correct one.  This is the patient's preferred pharmacy:   [id number] [place], [place] - [address] AT [facility] [address] [place] [address] Phone: [phone] Fax: [phone]   Has the prescription been filled recently? No.   Is the patient out of the medication? No  Has the patient been seen for an appointment in the last year OR does the patient have an upcoming appointment? Yes  Can we respond through MyChart? Yes  Agent: Please be advised that Rx refills may take up to 3 business days. We ask that you follow-up with your pharmacy.

## 2024-02-29 NOTE — Telephone Encounter (Signed)
 Lvmtcb, sent mychart message

## 2024-02-29 NOTE — Telephone Encounter (Signed)
 Patient is overdue for CPE, please call and schedule.

## 2024-03-01 ENCOUNTER — Telehealth: Payer: Self-pay | Admitting: Family Medicine

## 2024-03-01 NOTE — Telephone Encounter (Signed)
 Patient scheduled.

## 2024-03-01 NOTE — Telephone Encounter (Signed)
 Copied from CRM [id number]. Topic: General - Other >> Mar 01, 2024 11:03 AM Turkey A wrote: Reason for CRM: Patient called to inquire if he can have his prescriptions filled now that he has appt schedule-please call

## 2024-03-02 MED ORDER — ALLOPURINOL 100 MG PO TABS: 100.0000 mg | ORAL_TABLET | Freq: Every day | ORAL | 0 refills | Status: DC

## 2024-03-02 MED ORDER — HYDROCHLOROTHIAZIDE 25 MG PO TABS: 25.0000 mg | ORAL_TABLET | Freq: Every day | ORAL | 0 refills | Status: DC

## 2024-03-02 MED ORDER — SERTRALINE HCL 100 MG PO TABS: 100.0000 mg | ORAL_TABLET | Freq: Every day | ORAL | 0 refills | Status: DC

## 2024-03-09 ENCOUNTER — Telehealth: Payer: Self-pay

## 2024-03-09 MED ORDER — SERTRALINE HCL 100 MG PO TABS: 100.0000 mg | ORAL_TABLET | Freq: Every day | ORAL | 0 refills | Status: DC

## 2024-03-09 MED ORDER — ALLOPURINOL 100 MG PO TABS: 100.0000 mg | ORAL_TABLET | Freq: Every day | ORAL | 0 refills | Status: DC

## 2024-03-09 MED ORDER — HYDROCHLOROTHIAZIDE 25 MG PO TABS: 25.0000 mg | ORAL_TABLET | Freq: Every day | ORAL | 0 refills | Status: DC

## 2024-03-09 NOTE — Telephone Encounter (Signed)
 Copied from CRM [id number]. Topic: Clinical - Prescription Issue >> Mar 09, 2024  1:14 PM Gurney Maxin H wrote: Reason for CRM: Patient states due to insurance purposes he needs his allopurinol (ZYLOPRIM) 100 MG tablet, hydrochlorothiazide (HYDRODIURIL) 25 MG tablet and sertraline (ZOLOFT) 100 MG tablet  sent to [facility], prescriptions were originally sent to [facility].

## 2024-03-09 NOTE — Addendum Note (Signed)
 Addended by: Shon Millet on: 03/09/2024 03:05 PM   Modules accepted: Orders

## 2024-03-21 ENCOUNTER — Other Ambulatory Visit: Payer: Self-pay | Admitting: Family Medicine

## 2024-03-21 NOTE — Telephone Encounter (Signed)
 Will hold for CPE tomorrow

## 2024-03-22 ENCOUNTER — Ambulatory Visit (INDEPENDENT_AMBULATORY_CARE_PROVIDER_SITE_OTHER): Admitting: Family Medicine

## 2024-03-22 ENCOUNTER — Encounter: Payer: Self-pay | Admitting: Family Medicine

## 2024-03-22 VITALS — BP 141/88 | HR 62 | Temp 97.9°F | Ht 72.0 in | Wt 310.4 lb

## 2024-03-22 DIAGNOSIS — Z125 Encounter for screening for malignant neoplasm of prostate: Secondary | ICD-10-CM | POA: Diagnosis not present

## 2024-03-22 DIAGNOSIS — D126 Benign neoplasm of colon, unspecified: Secondary | ICD-10-CM

## 2024-03-22 DIAGNOSIS — R7303 Prediabetes: Secondary | ICD-10-CM | POA: Diagnosis not present

## 2024-03-22 DIAGNOSIS — E786 Lipoprotein deficiency: Secondary | ICD-10-CM | POA: Diagnosis not present

## 2024-03-22 DIAGNOSIS — Z79899 Other long term (current) drug therapy: Secondary | ICD-10-CM

## 2024-03-22 DIAGNOSIS — Z0000 Encounter for general adult medical examination without abnormal findings: Secondary | ICD-10-CM | POA: Diagnosis not present

## 2024-03-22 DIAGNOSIS — I10 Essential (primary) hypertension: Secondary | ICD-10-CM | POA: Diagnosis not present

## 2024-03-22 DIAGNOSIS — M109 Gout, unspecified: Secondary | ICD-10-CM | POA: Diagnosis not present

## 2024-03-22 DIAGNOSIS — K219 Gastro-esophageal reflux disease without esophagitis: Secondary | ICD-10-CM

## 2024-03-22 DIAGNOSIS — F419 Anxiety disorder, unspecified: Secondary | ICD-10-CM

## 2024-03-22 DIAGNOSIS — F32A Depression, unspecified: Secondary | ICD-10-CM

## 2024-03-22 LAB — CBC WITH DIFFERENTIAL/PLATELET
Basophils Absolute: 0 10*3/uL (ref 0.0–0.1)
Basophils Relative: 0.5 % (ref 0.0–3.0)
Eosinophils Absolute: 0.2 10*3/uL (ref 0.0–0.7)
Eosinophils Relative: 3 % (ref 0.0–5.0)
HCT: 46.8 % (ref 39.0–52.0)
Hemoglobin: 16.1 g/dL (ref 13.0–17.0)
Lymphocytes Relative: 36 % (ref 12.0–46.0)
Lymphs Abs: 2.2 10*3/uL (ref 0.7–4.0)
MCHC: 34.4 g/dL (ref 30.0–36.0)
MCV: 86.1 fl (ref 78.0–100.0)
Monocytes Absolute: 0.4 10*3/uL (ref 0.1–1.0)
Monocytes Relative: 6.4 % (ref 3.0–12.0)
Neutro Abs: 3.2 10*3/uL (ref 1.4–7.7)
Neutrophils Relative %: 54.1 % (ref 43.0–77.0)
Platelets: 192 10*3/uL (ref 150.0–400.0)
RBC: 5.43 Mil/uL (ref 4.22–5.81)
RDW: 13.4 % (ref 11.5–15.5)
WBC: 6 10*3/uL (ref 4.0–10.5)

## 2024-03-22 LAB — HEMOGLOBIN A1C: Hgb A1c MFr Bld: 7.3 % — ABNORMAL HIGH (ref 4.6–6.5)

## 2024-03-22 LAB — LIPID PANEL
Cholesterol: 160 mg/dL (ref 0–200)
HDL: 29 mg/dL — ABNORMAL LOW (ref >39.00)
LDL Cholesterol: 102 mg/dL — ABNORMAL HIGH (ref 0–99)
NonHDL: 131.29
Total CHOL/HDL Ratio: 6
Triglycerides: 146 mg/dL (ref 0.0–149.0)
VLDL: 29.2 mg/dL (ref 0.0–40.0)

## 2024-03-22 LAB — COMPREHENSIVE METABOLIC PANEL WITH GFR
ALT: 45 U/L (ref 0–53)
AST: 27 U/L (ref 0–37)
Albumin: 4.7 g/dL (ref 3.5–5.2)
Alkaline Phosphatase: 76 U/L (ref 39–117)
BUN: 23 mg/dL (ref 6–23)
CO2: 32 meq/L (ref 19–32)
Calcium: 9.1 mg/dL (ref 8.4–10.5)
Chloride: 101 meq/L (ref 96–112)
Creatinine, Ser: 1.08 mg/dL (ref 0.40–1.50)
GFR: 80.46 mL/min
Glucose, Bld: 135 mg/dL — ABNORMAL HIGH (ref 70–99)
Potassium: 4.1 meq/L (ref 3.5–5.1)
Sodium: 139 meq/L (ref 135–145)
Total Bilirubin: 1.1 mg/dL (ref 0.2–1.2)
Total Protein: 7.2 g/dL (ref 6.0–8.3)

## 2024-03-22 LAB — URIC ACID: Uric Acid, Serum: 8.2 mg/dL — ABNORMAL HIGH (ref 4.0–7.8)

## 2024-03-22 NOTE — Patient Instructions (Addendum)
 Ask your insurance co if they pay for GLP-1 drug for weight loss (not diabetes)  Obesity and prediabetes     Try to get most of your carbohydrates from produce (with the exception of white potatoes) and whole grains Eat less bread/pasta/rice/snack foods/cereals/sweets and other items from the middle of the grocery store (processed carbs)  Get a blood pressure cuff  For arm  Size large   Check blood pressure when relaxed  Arm supported at heart level  Keep a log   Follow up here in 1-2 months with your blood pressure cuff and your log  We will re check blood pressure    Labs today   Check out the book OUTLIVE by Dr Loyal Gambler  It has an interesting take on keeping healthy and you may find it interesting   Don't forget to see dermatology for a mole check  Also wear sun protecton

## 2024-03-22 NOTE — Assessment & Plan Note (Signed)
 Some grief- lost father and uncle  Then job stress Getting back to normal   Continues  Zoloft 100 mg daily /wants to continue   Discussed emotional eating  Declines counseling currently

## 2024-03-22 NOTE — Assessment & Plan Note (Signed)
 Turning 48 in the summer  Psa added to labs No fam history  No voiding changes

## 2024-03-22 NOTE — Assessment & Plan Note (Signed)
 Lab Results  Component Value Date   HGBA1C 6.1 01/12/2023   HGBA1C 5.9 11/06/2021   HGBA1C 6.4 11/12/2020   Ordered today  disc imp of low glycemic diet and wt loss to prevent DM2

## 2024-03-22 NOTE — Assessment & Plan Note (Addendum)
 Reviewed health habits including diet and exercise and skin cancer prevention Reviewed appropriate screening tests for age  Also reviewed health mt list, fam hx and immunization status , as well as social and family history   See HPI Labs reviewed and ordered Health Maintenance  Topic Date Due   Flu Shot  03/27/2024*   COVID-19 Vaccine (1) 11/22/2024*   Pneumococcal Vaccination (1 of 2 - PCV) 03/22/2025*   Hepatitis C Screening  11/07/2031*   HIV Screening  11/07/2031*   Colon Cancer Screening  04/15/2030   DTaP/Tdap/Td vaccine (3 - Td or Tdap) 11/07/2031   HPV Vaccine  Aged Out  *Topic was postponed. The date shown is not the original due date.   Declines flu and pna vaccines  Psa added to labs  Discussed fall prevention, supplements and exercise for bone density  PHQ 2  with medication  Labs ordered  Strongly recommend derm visit for moles on back-pt wants to make own appt

## 2024-03-22 NOTE — Assessment & Plan Note (Signed)
 B12 added to labs Has been able to cut dose to three days weekly

## 2024-03-22 NOTE — Assessment & Plan Note (Signed)
 Disc goals for lipids and reasons to control them Rev last labs with pt Rev low sat fat diet in detail  Lab today  Excellent exercise

## 2024-03-22 NOTE — Assessment & Plan Note (Signed)
 Discussed how this problem influences overall health and the risks it imposes  Reviewed plan for weight loss with lower calorie diet (via better food choices (lower glycemic and portion control) along with exercise building up to or more than 30 minutes 5 days per week including some aerobic activity and strength training    Pt is interested in glp-1 in future if covered  Some emotional eating- declines counseling right now by may consider later [place] exercise

## 2024-03-22 NOTE — Assessment & Plan Note (Signed)
 Pt is interested in cutting back allopurinol No flares Drinks lots of fluids but does take hydrochlorothiazide  Uric acid added to lab today

## 2024-03-22 NOTE — Assessment & Plan Note (Signed)
 Has been able to cut lansoprazole 30 mg to three days weekly  Watching diet  Improved

## 2024-03-22 NOTE — Progress Notes (Signed)
 Subjective:    Patient ID: Samuel Murray, male    DOB: [birth year], 48 y.o.   MRN: [id number]  HPI  Here for health maintenance exam and to review chronic medical problems   Wt Readings from Last 3 Encounters:  03/22/24 (!) 310 lb 6 oz (140.8 kg)  04/16/23 (!) 302 lb (137 kg)  01/12/23 (!) 302 lb 4 oz (137.1 kg)   42.09 kg/m  Vitals:   03/22/24 0847 03/22/24 0921  BP: (!) 148/94 (!) 141/88  Pulse: 62   Temp: 97.9 F (36.6 C)   SpO2: 96%     Immunization History  Administered Date(s) Administered   Influenza Split 09/12/2011   Influenza,inj,Quad PF,6+ Mos 12/06/2015, 11/09/2016, 09/26/2019, 10/11/2020, 11/06/2021   Influenza-Unspecified 10/04/2017, 10/17/2018, 10/26/2022   Td 02/27/2010   Tdap 11/06/2021    There are no preventive care reminders to display for this patient.  Flu shot -declines  Pna shot declines    Prostate health No problems  Drinks a lot of fluid / if he urinates at night that is why  Turns 48 in July   Colon cancer screening  03/2023 - 7 year recall   Bone health   Falls-none Fractures-none  Supplements - tumeric , magnesium, B12 , vitamin D , fish oil , zinc     Exercise :  5 days per week 90 minutes      Mood    03/22/2024   10:01 AM 01/12/2023    3:12 PM 11/06/2021    9:59 AM 11/19/2020    4:37 PM 09/26/2019    3:28 PM  Depression screen PHQ 2/9  Decreased Interest 1 0 1 1 0  Down, Depressed, Hopeless 1 1 1 1  0  PHQ - 2 Score 2 1 2 2  0  Altered sleeping 2 0 1 2 0  Tired, decreased energy 2 0 0 2 0  Change in appetite 2 1 1 2  0  Feeling bad or failure about yourself  1 0 1 1 0  Trouble concentrating 1 0 1 1 0  Moving slowly or fidgety/restless 0 0 2 0 0  Suicidal thoughts 0 0 0 0 0  PHQ-9 Score 10 2 8 10  0  Difficult doing work/chores Somewhat difficult Not difficult at all Not difficult at all Somewhat difficult Not difficult at all   Grief  Father and uncle died  He got laid off , then got job back the next  week  Takes zoloft 100 mg daily   Thinks anxiety fuels eating issues  Does not want to see therapist yet     HTN bp is stable today  No cp or palpitations or headaches or edema  No side effects to medicines  BP Readings from Last 3 Encounters:  03/22/24 (!) 141/88  04/16/23 (!) 110/92  01/12/23 134/81    Hydrochlorothiazide 25 mg daily   Lab Results  Component Value Date   NA 139 01/12/2023   K 4.2 01/12/2023   CO2 30 01/12/2023   GLUCOSE 91 01/12/2023   BUN 24 (H) 01/12/2023   CREATININE 1.08 01/12/2023   CALCIUM 9.1 01/12/2023   GFR 81.14 01/12/2023   GFRNONAA 66.53 02/11/2010   Due for labs   GERD  Lansoprazole 30 mg daily -now taking 3 days per week   Lab Results  Component Value Date   VITAMINB12 960 (H) 01/12/2023   History of gout  Lab Results  Component Value Date   LABURIC 6.7 01/12/2023   Allopurinol 100 mg  daily - takes 4 days per week  No gout flares at all   Prediabetes Lab Results  Component Value Date   HGBA1C 6.1 01/12/2023   HGBA1C 5.9 11/06/2021   HGBA1C 6.4 11/12/2020   Due for labs   Eats worse on weekend  Eats sweets every day - small amt of chocolate (4 small pc of choc)  No etoh  No beverages with calories   Some processed food  Tries to meal prep  Eats out once per week  Less snack foods/ eats raw veggies instead - started in the past 2 weeks   Portion control is an issue   Lab Results  Component Value Date   CHOL 174 01/12/2023   HDL 30.60 (L) 01/12/2023   LDLCALC 104 (H) 11/06/2021   LDLDIRECT 125.0 01/12/2023   TRIG 209.0 (H) 01/12/2023   CHOLHDL 6 01/12/2023      Patient Active Problem List   Diagnosis Date Noted   Prostate cancer screening 03/22/2024   Adenomatous polyp of colon 04/16/2023   Current use of proton pump inhibitor 01/12/2023   Screening for colon cancer 11/06/2021   Prediabetes 09/20/2019   Foot pain, bilateral 05/19/2018   DDD (degenerative disc disease), lumbar 05/12/2017   Low HDL  (under 40) 04/22/2016   Routine general medical examination at a health care facility 12/06/2015   Anxiety and depression 03/13/2015   Essential hypertension 03/13/2015   Gout 03/06/2015   GERD (gastroesophageal reflux disease) 02/05/2013   Morbid obesity (HCC) 03/01/2008   Attention deficit disorder 02/01/2008   Allergic rhinitis 02/01/2008   Asthma 02/01/2008   Past Medical History:  Diagnosis Date   Allergic rhinitis    Asthma    Attention deficit disorder 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    Depression    Elevated blood pressure reading without diagnosis of hypertension    Obesity, unspecified    Past Surgical History:  Procedure Laterality Date   ANKLE SURGERY     COLONOSCOPY WITH PROPOFOL N/A 04/16/2023   Procedure: COLONOSCOPY WITH PROPOFOL;  Surgeon: Wyline Mood, MD;  Location: [facility] ENDOSCOPY;  Service: Gastroenterology;  Laterality: N/A;   TONSILLECTOMY AND ADENOIDECTOMY     Social History   Tobacco Use   Smoking status: Former    Current packs/day: 0.00    Types: Cigarettes    Quit date: 12/28/2000    Years since quitting: 23.2   Smokeless tobacco: Never  Vaping Use   Vaping status: Never Used  Substance Use Topics   Alcohol use: No    Alcohol/week: 0.0 standard drinks of alcohol   Drug use: No   Family History  Problem Relation Age of Onset   Hypertension Father    Other Father        heart problems   Heart disease Father    Hypothyroidism Sister    Thyroid disease Sister        hypothyroid   Allergies  Allergen Reactions   Escitalopram Oxalate     REACTION: sedation   Prozac [Fluoxetine Hcl]     Wt gain    Current Outpatient Medications on File Prior to Visit  Medication Sig Dispense Refill   albuterol (VENTOLIN HFA) 108 (90 Base) MCG/ACT inhaler Inhale 2 puffs into the lungs every 4 (four) hours as needed. 54 g 1   allopurinol (ZYLOPRIM) 100 MG tablet Take 1 tablet (100 mg total) by mouth daily. 90 tablet 0   colchicine 0.6 MG tablet Take 1  tablet (0.6 mg total) by mouth 2 (  two) times daily as needed (for gout flare). 30 tablet 3   fluticasone (FLONASE) 50 MCG/ACT nasal spray USE 2 SPRAYS IN EACH NOSTRIL EVERY DAY 48 g 3   hydrochlorothiazide (HYDRODIURIL) 25 MG tablet Take 1 tablet (25 mg total) by mouth daily. 90 tablet 0   lansoprazole (PREVACID) 30 MG capsule Take 1 capsule (30 mg total) by mouth daily. 90 capsule 1   sertraline (ZOLOFT) 100 MG tablet Take 1 tablet (100 mg total) by mouth daily. 90 tablet 0   No current facility-administered medications on file prior to visit.    Review of Systems  Constitutional:  Negative for activity change, appetite change, fatigue, fever and unexpected weight change.       Frustrated with weight   HENT:  Negative for congestion, rhinorrhea, sore throat and trouble swallowing.   Eyes:  Negative for pain, redness, itching and visual disturbance.  Respiratory:  Negative for cough, chest tightness, shortness of breath and wheezing.   Cardiovascular:  Negative for chest pain and palpitations.  Gastrointestinal:  Negative for abdominal pain, blood in stool, constipation, diarrhea and nausea.  Endocrine: Negative for cold intolerance, heat intolerance, polydipsia and polyuria.  Genitourinary:  Negative for difficulty urinating, dysuria, frequency and urgency.  Musculoskeletal:  Negative for arthralgias, joint swelling and myalgias.  Skin:  Negative for pallor and rash.  Neurological:  Negative for dizziness, tremors, weakness, numbness and headaches.  Hematological:  Negative for adenopathy. Does not bruise/bleed easily.  Psychiatric/Behavioral:  Negative for decreased concentration and dysphoric mood. The patient is nervous/anxious.        Some emotional eating          Objective:   Physical Exam Constitutional:      General: He is not in acute distress.    Appearance: Normal appearance. He is well-developed. He is obese. He is not ill-appearing or diaphoretic.  HENT:     Head:  Normocephalic and atraumatic.     Right Ear: Tympanic membrane, ear canal and external ear normal.     Left Ear: Tympanic membrane, ear canal and external ear normal.     Nose: Nose normal. No congestion.     Mouth/Throat:     Mouth: Mucous membranes are moist.     Pharynx: Oropharynx is clear. No posterior oropharyngeal erythema.  Eyes:     General: No scleral icterus.       Right eye: No discharge.        Left eye: No discharge.     Conjunctiva/sclera: Conjunctivae normal.     Pupils: Pupils are equal, round, and reactive to light.  Neck:     Thyroid: No thyromegaly.     Vascular: No carotid bruit or JVD.  Cardiovascular:     Rate and Rhythm: Normal rate and regular rhythm.     Pulses: Normal pulses.     Heart sounds: Normal heart sounds.     No gallop.  Pulmonary:     Effort: Pulmonary effort is normal. No respiratory distress.     Breath sounds: Normal breath sounds. No wheezing or rales.     Comments: Good air exch Chest:     Chest wall: No tenderness.  Abdominal:     General: Bowel sounds are normal. There is no distension or abdominal bruit.     Palpations: Abdomen is soft. There is no mass.     Tenderness: There is no abdominal tenderness.     Hernia: No hernia is present.  Musculoskeletal:  General: No tenderness.     Cervical back: Normal range of motion and neck supple. No rigidity. No muscular tenderness.     Right lower leg: No edema.     Left lower leg: No edema.  Lymphadenopathy:     Cervical: No cervical adenopathy.  Skin:    General: Skin is warm and dry.     Coloration: Skin is not pale.     Findings: No erythema or rash.     Comments: Solar lentigines diffusely  Some irregular moles on back/ macular and brown   Neurological:     Mental Status: He is alert.     Cranial Nerves: No cranial nerve deficit.     Motor: No abnormal muscle tone.     Coordination: Coordination normal.     Gait: Gait normal.     Deep Tendon Reflexes: Reflexes are  normal and symmetric. Reflexes normal.  Psychiatric:        Mood and Affect: Mood normal.        Cognition and Memory: Cognition and memory normal.           Assessment & Plan:   Problem List Items Addressed This Visit       Cardiovascular and Mediastinum   Essential hypertension   Not in optimal control today  BP: (!) 141/88   Reviewed lifestyle habits Takes hydrochlorothiazide 25 mg daily   Plans to start checking blood pressure at home (reviewed correct time and technique)  Plan follow up in 1-2 mo with cuff from home and log  Will add medication if not improved        Relevant Orders   CBC with Differential/Platelet   Comprehensive metabolic panel   Lipid Panel   TSH     Digestive   GERD (gastroesophageal reflux disease)   Has been able to cut lansoprazole 30 mg to three days weekly  Watching diet  Improved       Adenomatous polyp of colon   Colonoscopy 03/2023 with 7 y recall No symptoms        Other   Routine general medical examination at a health care facility - Primary   Reviewed health habits including diet and exercise and skin cancer prevention Reviewed appropriate screening tests for age  Also reviewed health mt list, fam hx and immunization status , as well as social and family history   See HPI Labs reviewed and ordered Health Maintenance  Topic Date Due   Flu Shot  03/27/2024*   COVID-19 Vaccine (1) 11/22/2024*   Pneumococcal Vaccination (1 of 2 - PCV) 03/22/2025*   Hepatitis C Screening  11/07/2031*   HIV Screening  11/07/2031*   Colon Cancer Screening  04/15/2030   DTaP/Tdap/Td vaccine (3 - Td or Tdap) 11/07/2031   HPV Vaccine  Aged Out  *Topic was postponed. The date shown is not the original due date.   Declines flu and pna vaccines  Psa added to labs  Discussed fall prevention, supplements and exercise for bone density  PHQ 2  with medication  Labs ordered  Strongly recommend derm visit for moles on back-pt wants to make own  appt      Prostate cancer screening   Turning 48 in the summer  Psa added to labs No fam history  No voiding changes       Relevant Orders   PSA   Prediabetes   Lab Results  Component Value Date   HGBA1C 6.1 01/12/2023   HGBA1C 5.9 11/06/2021  HGBA1C 6.4 11/12/2020   Ordered today  disc imp of low glycemic diet and wt loss to prevent DM2       Relevant Orders   Hemoglobin A1c   Morbid obesity (HCC)   Discussed how this problem influences overall health and the risks it imposes  Reviewed plan for weight loss with lower calorie diet (via better food choices (lower glycemic and portion control) along with exercise building up to or more than 30 minutes 5 days per week including some aerobic activity and strength training    Pt is interested in glp-1 in future if covered  Some emotional eating- declines counseling right now by may consider later Great exercise        Low HDL (under 40)   Disc goals for lipids and reasons to control them Rev last labs with pt Rev low sat fat diet in detail  Lab today  Excellent exercise        Relevant Orders   Lipid Panel   Gout   Pt is interested in cutting back allopurinol No flares Drinks lots of fluids but does take hydrochlorothiazide  Uric acid added to lab today      Relevant Orders   Uric Acid   Current use of proton pump inhibitor   B12 added to labs Has been able to cut dose to three days weekly      Relevant Orders   Vitamin B12   Anxiety and depression   Some grief- lost father and uncle  Then job stress Getting back to normal   Continues  Zoloft 100 mg daily /wants to continue   Discussed emotional eating  Declines counseling currently

## 2024-03-22 NOTE — Assessment & Plan Note (Signed)
 Not in optimal control today  BP: (!) 141/88   Reviewed lifestyle habits Takes hydrochlorothiazide 25 mg daily   Plans to start checking blood pressure at home (reviewed correct time and technique)  Plan follow up in 1-2 mo with cuff from home and log  Will add medication if not improved

## 2024-03-22 NOTE — Assessment & Plan Note (Signed)
 Colonoscopy 03/2023 with 7 y recall No symptoms

## 2024-03-23 ENCOUNTER — Encounter: Payer: Self-pay | Admitting: Family Medicine

## 2024-03-23 LAB — TSH: TSH: 1.37 u[IU]/mL (ref 0.35–5.50)

## 2024-03-23 LAB — PSA: PSA: 1.39 ng/mL (ref 0.10–4.00)

## 2024-03-23 LAB — VITAMIN B12: Vitamin B-12: 778 pg/mL (ref 211–911)

## 2024-05-14 ENCOUNTER — Encounter: Payer: Self-pay | Admitting: Family Medicine

## 2024-05-16 ENCOUNTER — Encounter: Payer: Self-pay | Admitting: Family Medicine

## 2024-05-16 ENCOUNTER — Ambulatory Visit (INDEPENDENT_AMBULATORY_CARE_PROVIDER_SITE_OTHER): Admitting: Family Medicine

## 2024-05-16 VITALS — BP 142/92 | HR 65 | Temp 98.5°F | Ht 72.0 in | Wt 305.2 lb

## 2024-05-16 DIAGNOSIS — I10 Essential (primary) hypertension: Secondary | ICD-10-CM | POA: Diagnosis not present

## 2024-05-16 MED ORDER — LOSARTAN POTASSIUM 50 MG PO TABS: 50.0000 mg | ORAL_TABLET | Freq: Every day | ORAL | 0 refills | Status: DC

## 2024-05-16 NOTE — Assessment & Plan Note (Signed)
 bp is up and down/ not always at goal  BP Readings from Last 1 Encounters:  05/16/24 (!) 142/92   No changes needed Most recent labs reviewed  Disc lifstyle change with low sodium diet and exercise -he is working on this  Plan to add losartan 50 mg daily  Continue hydrochlorothiazide  25 mg daily   Follow up 2-4 wk for visit with labs  Encouraged to call if side effects

## 2024-05-16 NOTE — Progress Notes (Signed)
 Subjective:    Patient ID: Samuel Murray, male    DOB: [birth year], 48 y.o.   MRN: [id number]  HPI  Wt Readings from Last 3 Encounters:  05/16/24 (!) 305 lb 4 oz (138.5 kg)  03/22/24 (!) 310 lb 6 oz (140.8 kg)  04/16/23 (!) 302 lb (137 kg)   41.40 kg/m  Vitals:   05/16/24 0846 05/16/24 0904  BP: (!) 144/96 (!) 142/92  Pulse: 65   Temp: 98.5 F (36.9 C)   SpO2: 96%     Pt presents for follow up of HTN    No cp or palpitations or headaches or edema  No side effects to medicines  BP Readings from Last 3 Encounters:  05/16/24 (!) 142/92  03/22/24 (!) 141/88  04/16/23 (!) 110/92    Pt's cuff 144/102  Takes hydrochlorothiazide  25 mg daily   Weight is down 5 lb   Fast for 2 d per week (600 calories on those days)  mon and thurs Otherwise eats what he wants - does watch portions   Still exercising  Getting enough protein   Staying away from processed food and salty stuff        Lab Results  Component Value Date   NA 139 03/22/2024   K 4.1 03/22/2024   CO2 32 03/22/2024   GLUCOSE 135 (H) 03/22/2024   BUN 23 03/22/2024   CREATININE 1.08 03/22/2024   CALCIUM 9.1 03/22/2024   GFR 80.46 03/22/2024   GFRNONAA 66.53 02/11/2010       Blood pressure record from home  Range  High 120s to mid 140s (on 153 and one 112) , over 70s-80s usually   Patient Active Problem List   Diagnosis Date Noted   Prostate cancer screening 03/22/2024   Adenomatous polyp of colon 04/16/2023   Current use of proton pump inhibitor 01/12/2023   Screening for colon cancer 11/06/2021   Prediabetes 09/20/2019   Foot pain, bilateral 05/19/2018   DDD (degenerative disc disease), lumbar 05/12/2017   Low HDL (under 40) 04/22/2016   Routine general medical examination at a health care facility 12/06/2015   Anxiety and depression 03/13/2015   Essential hypertension 03/13/2015   Gout 03/06/2015   GERD (gastroesophageal reflux disease) 02/05/2013   Morbid obesity (HCC) 03/01/2008    Attention deficit disorder 02/01/2008   Allergic rhinitis 02/01/2008   Asthma 02/01/2008   Past Medical History:  Diagnosis Date   Allergic rhinitis    Asthma    Attention deficit disorder 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    Depression    Elevated blood pressure reading without diagnosis of hypertension    Obesity, unspecified    Past Surgical History:  Procedure Laterality Date   ANKLE SURGERY     COLONOSCOPY WITH PROPOFOL  N/A 04/16/2023   Procedure: COLONOSCOPY WITH PROPOFOL ;  Surgeon: Luke Salaam, MD;  Location: [facility] ENDOSCOPY;  Service: Gastroenterology;  Laterality: N/A;   TONSILLECTOMY AND ADENOIDECTOMY     Social History   Tobacco Use   Smoking status: Former    Current packs/day: 0.00    Types: Cigarettes    Quit date: 12/28/2000    Years since quitting: 23.3   Smokeless tobacco: Never  Vaping Use   Vaping status: Never Used  Substance Use Topics   Alcohol use: No    Alcohol/week: 0.0 standard drinks of alcohol   Drug use: No   Family History  Problem Relation Age of Onset   Hypertension Father    Other Father  heart problems   Heart disease Father    Hypothyroidism Sister    Thyroid  disease Sister        hypothyroid   Allergies  Allergen Reactions   Escitalopram Oxalate     REACTION: sedation   Prozac  [Fluoxetine  Hcl]     Wt gain    Current Outpatient Medications on File Prior to Visit  Medication Sig Dispense Refill   albuterol  (VENTOLIN  HFA) 108 (90 Base) MCG/ACT inhaler Inhale 2 puffs into the lungs every 4 (four) hours as needed. 54 g 1   allopurinol  (ZYLOPRIM ) 100 MG tablet Take 1 tablet (100 mg total) by mouth daily. 90 tablet 0   colchicine  0.6 MG tablet Take 1 tablet (0.6 mg total) by mouth 2 (two) times daily as needed (for gout flare). 30 tablet 3   fluticasone  (FLONASE ) 50 MCG/ACT nasal spray USE 2 SPRAYS IN EACH NOSTRIL EVERY DAY 48 g 3   hydrochlorothiazide  (HYDRODIURIL ) 25 MG tablet Take 1 tablet (25 mg total) by mouth daily. 90  tablet 0   lansoprazole  (PREVACID ) 30 MG capsule Take 1 capsule (30 mg total) by mouth daily. 90 capsule 1   sertraline  (ZOLOFT ) 100 MG tablet Take 1 tablet (100 mg total) by mouth daily. 90 tablet 0   No current facility-administered medications on file prior to visit.    Review of Systems  Constitutional:  Negative for activity change, appetite change, fatigue, fever and unexpected weight change.  HENT:  Negative for congestion, rhinorrhea, sore throat and trouble swallowing.   Eyes:  Negative for pain, redness, itching and visual disturbance.  Respiratory:  Negative for cough, chest tightness, shortness of breath and wheezing.   Cardiovascular:  Negative for chest pain and palpitations.  Gastrointestinal:  Negative for abdominal pain, blood in stool, constipation, diarrhea and nausea.  Endocrine: Negative for cold intolerance, heat intolerance, polydipsia and polyuria.  Genitourinary:  Negative for difficulty urinating, dysuria, frequency and urgency.  Musculoskeletal:  Positive for arthralgias. Negative for joint swelling and myalgias.  Skin:  Negative for pallor and rash.  Neurological:  Negative for dizziness, tremors, weakness, numbness and headaches.  Hematological:  Negative for adenopathy. Does not bruise/bleed easily.  Psychiatric/Behavioral:  Negative for decreased concentration and dysphoric mood. The patient is not nervous/anxious.        Objective:   Physical Exam Constitutional:      General: He is not in acute distress.    Appearance: Normal appearance. He is well-developed. He is obese. He is not ill-appearing or diaphoretic.  HENT:     Head: Normocephalic and atraumatic.  Eyes:     Conjunctiva/sclera: Conjunctivae normal.     Pupils: Pupils are equal, round, and reactive to light.  Neck:     Thyroid : No thyromegaly.     Vascular: No carotid bruit or JVD.  Cardiovascular:     Rate and Rhythm: Normal rate and regular rhythm.     Heart sounds: Normal heart  sounds.     No gallop.  Pulmonary:     Effort: Pulmonary effort is normal. No respiratory distress.     Breath sounds: Normal breath sounds. No wheezing or rales.  Abdominal:     General: There is no distension or abdominal bruit.     Palpations: Abdomen is soft.  Musculoskeletal:     Cervical back: Normal range of motion and neck supple.     Right lower leg: No edema.     Left lower leg: No edema.  Lymphadenopathy:     Cervical:  No cervical adenopathy.  Skin:    General: Skin is warm and dry.     Coloration: Skin is not pale.     Findings: No rash.  Neurological:     Mental Status: He is alert.     Coordination: Coordination normal.     Deep Tendon Reflexes: Reflexes are normal and symmetric. Reflexes normal.  Psychiatric:        Mood and Affect: Mood normal.           Assessment & Plan:   Problem List Items Addressed This Visit       Cardiovascular and Mediastinum   Essential hypertension - Primary   bp is up and down/ not always at goal  BP Readings from Last 1 Encounters:  05/16/24 (!) 142/92   No changes needed Most recent labs reviewed  Disc lifstyle change with low sodium diet and exercise -he is working on this  Plan to add losartan 50 mg daily  Continue hydrochlorothiazide  25 mg daily   Follow up 2-4 wk for visit with labs  Encouraged to call if side effects        Relevant Medications   losartan (COZAAR) 50 MG tablet

## 2024-05-16 NOTE — Patient Instructions (Addendum)
 Continue the hydrochlorothiazide   Add losartan 50 mg daily    If any problems or side effects let us  know   Continue to watch your blood pressure   Keep up the effort -diet/exercise  Avoid processed foods Take care of yourself   Follow up here in 2-4 weeks for visit with labs (you don't need to fast)

## 2024-06-06 ENCOUNTER — Encounter: Payer: Self-pay | Admitting: Family Medicine

## 2024-06-06 ENCOUNTER — Ambulatory Visit (INDEPENDENT_AMBULATORY_CARE_PROVIDER_SITE_OTHER): Admitting: Family Medicine

## 2024-06-06 ENCOUNTER — Other Ambulatory Visit: Payer: Self-pay | Admitting: Family Medicine

## 2024-06-06 VITALS — BP 119/80 | HR 67 | Temp 98.8°F | Ht 72.0 in | Wt 306.5 lb

## 2024-06-06 DIAGNOSIS — E785 Hyperlipidemia, unspecified: Secondary | ICD-10-CM

## 2024-06-06 DIAGNOSIS — I10 Essential (primary) hypertension: Secondary | ICD-10-CM

## 2024-06-06 DIAGNOSIS — Z7984 Long term (current) use of oral hypoglycemic drugs: Secondary | ICD-10-CM | POA: Diagnosis not present

## 2024-06-06 DIAGNOSIS — E669 Obesity, unspecified: Secondary | ICD-10-CM

## 2024-06-06 DIAGNOSIS — E1169 Type 2 diabetes mellitus with other specified complication: Secondary | ICD-10-CM | POA: Insufficient documentation

## 2024-06-06 DIAGNOSIS — Z6841 Body Mass Index (BMI) 40.0 and over, adult: Secondary | ICD-10-CM

## 2024-06-06 MED ORDER — METFORMIN HCL ER 500 MG PO TB24: 500.0000 mg | ORAL_TABLET | Freq: Two times a day (BID) | ORAL | 1 refills | Status: DC

## 2024-06-06 MED ORDER — SERTRALINE HCL 100 MG PO TABS: 100.0000 mg | ORAL_TABLET | Freq: Every day | ORAL | 3 refills | Status: AC

## 2024-06-06 MED ORDER — ALLOPURINOL 100 MG PO TABS: 100.0000 mg | ORAL_TABLET | Freq: Every day | ORAL | 3 refills | Status: AC

## 2024-06-06 MED ORDER — LANSOPRAZOLE 30 MG PO CPDR: 30.0000 mg | DELAYED_RELEASE_CAPSULE | Freq: Every day | ORAL | 3 refills | Status: AC

## 2024-06-06 MED ORDER — LOSARTAN POTASSIUM 50 MG PO TABS: 50.0000 mg | ORAL_TABLET | Freq: Every day | ORAL | 3 refills | Status: AC

## 2024-06-06 MED ORDER — HYDROCHLOROTHIAZIDE 25 MG PO TABS: 25.0000 mg | ORAL_TABLET | Freq: Every day | ORAL | 3 refills | Status: AC

## 2024-06-06 NOTE — Patient Instructions (Addendum)
 Start metformin xr 500 mg twice daily for blood sugar  Any side effects let us  know   We can consider diabetic teaching in the future   Try to get most of your carbohydrates from produce (with the exception of white potatoes) and whole grains Eat less bread/pasta/rice/snack foods/cereals/sweets and other items from the middle of the grocery store (processed carbs) Eat lean protein    Keep exercising   Blood pressure is better  Continue other medicines   Follow up in 3 months

## 2024-06-06 NOTE — Assessment & Plan Note (Signed)
 Disc goals for lipids and reasons to control them Rev last labs with pt Rev low sat fat diet in detail Last LDL was 102 Goal for dm2 will be ynder 7-  Will discuss starting statin at next visit

## 2024-06-06 NOTE — Assessment & Plan Note (Signed)
 Discussed how this problem influences overall health and the risks it imposes  Reviewed plan for weight loss with lower calorie diet (via better food choices (lower glycemic and portion control) along with exercise building up to or more than 30 minutes 5 days per week including some aerobic activity and strength training   Pt is open to starting metformin for new dm2  May consider glp-1 in future Great exercise habits  Large appetite Discussed limiting portions and working on lower glycemic choices

## 2024-06-06 NOTE — Assessment & Plan Note (Signed)
 New Lab Results  Component Value Date   HGBA1C 7.3 (H) 03/22/2024   HGBA1C 6.1 01/12/2023   HGBA1C 5.9 11/06/2021   Plan to start metformin xr 500 mg bid  Discussed possible side effects  Reviewed goals for low glycemic diet  Declined referral to dm teaching or dm test equip yet Great exercise  Normal foot exam Will discuss statin at follow up  Will get microalbumin at follow up  On arb  Follow up in 3 months

## 2024-06-06 NOTE — Assessment & Plan Note (Signed)
 bp in fair control at this time -improved  BP Readings from Last 1 Encounters:  06/06/24 119/80   Plan to continue Hydrochlorothiazide  25 mg daily  Losartan  50 mg daily   No changes needed Most recent labs reviewed  Disc lifstyle change with low sodium diet and exercise

## 2024-06-06 NOTE — Progress Notes (Signed)
 Subjective:    Patient ID: Samuel Murray, male    DOB: [birth year], 48 y.o.   MRN: [id number]  HPI  Wt Readings from Last 3 Encounters:  06/06/24 (!) 306 lb 8 oz (139 kg)  05/16/24 (!) 305 lb 4 oz (138.5 kg)  03/22/24 (!) 310 lb 6 oz (140.8 kg)   41.57 kg/m  Vitals:   06/06/24 0800 06/06/24 0822  BP: (!) 146/94 119/80  Pulse: 67   Temp: 98.8 F (37.1 C)   SpO2: 97%     Pt presents for follow up of HTN, elevated A1c  and chronic medical problems   Feeling good   bp is stable today  No cp or palpitations or headaches or edema  No side effects to medicines  BP Readings from Last 3 Encounters:  06/06/24 119/80  05/16/24 (!) 142/92  03/22/24 (!) 141/88     Just came back from the gym  Has not checked blood pressure at home    Hydrochlorothiazide  25 mg daily  Last visit added losartan  50 mg daily   Lab Results  Component Value Date   NA 139 03/22/2024   K 4.1 03/22/2024   CO2 32 03/22/2024   GLUCOSE 135 (H) 03/22/2024   BUN 23 03/22/2024   CREATININE 1.08 03/22/2024   CALCIUM 9.1 03/22/2024   GFR 80.46 03/22/2024   GFRNONAA 66.53 02/11/2010    Prediabetes- now A1c in DM2 range  Lab Results  Component Value Date   HGBA1C 7.3 (H) 03/22/2024   HGBA1C 6.1 01/12/2023   HGBA1C 5.9 11/06/2021    Exercising at least 1 h 5 d per week   Working on diet  5:2 fasting  On the fasting days gets 600 calories   Has been eating more chicken for protein/grilled  Lean pork   Carbs - lots of vegetables now  Fruit instead of sweets or added sugar  Avoiding bread (occational low carb wrap if anything)  No pasta or rice   Portions are getting a lot better - that is what needs the most work    Eats out once a week at most and tries to split portion with another person      Fairly stable cholesterol Lab Results  Component Value Date   CHOL 160 03/22/2024   HDL 29.00 (L) 03/22/2024   LDLCALC 102 (H) 03/22/2024   LDLDIRECT 125.0 01/12/2023   TRIG 146.0  03/22/2024   CHOLHDL 6 03/22/2024   Lab Results  Component Value Date   ALT 45 03/22/2024   AST 27 03/22/2024   ALKPHOS 76 03/22/2024   BILITOT 1.1 03/22/2024   Gout Lab Results  Component Value Date   LABURIC 8.2 (H) 03/22/2024        Patient Active Problem List   Diagnosis Date Noted   Hyperlipidemia associated with type 2 diabetes mellitus (HCC) 06/06/2024   Prostate cancer screening 03/22/2024   Adenomatous polyp of colon 04/16/2023   Current use of proton pump inhibitor 01/12/2023   Screening for colon cancer 11/06/2021   Type 2 diabetes mellitus with obesity (HCC) 09/20/2019   Foot pain, bilateral 05/19/2018   DDD (degenerative disc disease), lumbar 05/12/2017   Low HDL (under 40) 04/22/2016   Routine general medical examination at a health care facility 12/06/2015   Anxiety and depression 03/13/2015   Essential hypertension 03/13/2015   Gout 03/06/2015   GERD (gastroesophageal reflux disease) 02/05/2013   Morbid obesity (HCC) 03/01/2008   Attention deficit disorder 02/01/2008   Allergic rhinitis  02/01/2008   Asthma 02/01/2008   Past Medical History:  Diagnosis Date   Allergic rhinitis    Asthma    Attention deficit disorder 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    Depression    Elevated blood pressure reading without diagnosis of hypertension    Obesity, unspecified    Past Surgical History:  Procedure Laterality Date   ANKLE SURGERY     COLONOSCOPY WITH PROPOFOL  N/A 04/16/2023   Procedure: COLONOSCOPY WITH PROPOFOL ;  Surgeon: Luke Salaam, MD;  Location: [facility] ENDOSCOPY;  Service: Gastroenterology;  Laterality: N/A;   TONSILLECTOMY AND ADENOIDECTOMY     Social History   Tobacco Use   Smoking status: Former    Current packs/day: 0.00    Types: Cigarettes    Quit date: 12/28/2000    Years since quitting: 23.4   Smokeless tobacco: Never  Vaping Use   Vaping status: Never Used  Substance Use Topics   Alcohol use: No    Alcohol/week: 0.0 standard drinks  of alcohol   Drug use: No   Family History  Problem Relation Age of Onset   Hypertension Father    Other Father        heart problems   Heart disease Father    Hypothyroidism Sister    Thyroid  disease Sister        hypothyroid   Allergies  Allergen Reactions   Escitalopram Oxalate     REACTION: sedation   Prozac  [Fluoxetine  Hcl]     Wt gain    Current Outpatient Medications on File Prior to Visit  Medication Sig Dispense Refill   albuterol  (VENTOLIN  HFA) 108 (90 Base) MCG/ACT inhaler Inhale 2 puffs into the lungs every 4 (four) hours as needed. 54 g 1   colchicine  0.6 MG tablet Take 1 tablet (0.6 mg total) by mouth 2 (two) times daily as needed (for gout flare). 30 tablet 3   fluticasone  (FLONASE ) 50 MCG/ACT nasal spray USE 2 SPRAYS IN EACH NOSTRIL EVERY DAY 48 g 3   No current facility-administered medications on file prior to visit.    Review of Systems  Constitutional:  Negative for activity change, appetite change, fatigue, fever and unexpected weight change.  HENT:  Negative for congestion, rhinorrhea, sore throat and trouble swallowing.   Eyes:  Negative for pain, redness, itching and visual disturbance.  Respiratory:  Negative for cough, chest tightness, shortness of breath and wheezing.   Cardiovascular:  Negative for chest pain and palpitations.  Gastrointestinal:  Negative for abdominal pain, blood in stool, constipation, diarrhea and nausea.  Endocrine: Positive for polyphagia. Negative for cold intolerance, heat intolerance, polydipsia and polyuria.  Genitourinary:  Negative for difficulty urinating, dysuria, frequency and urgency.  Musculoskeletal:  Negative for arthralgias, joint swelling and myalgias.  Skin:  Negative for pallor and rash.  Neurological:  Negative for dizziness, tremors, weakness, numbness and headaches.  Hematological:  Negative for adenopathy. Does not bruise/bleed easily.  Psychiatric/Behavioral:  Negative for decreased concentration and  dysphoric mood. The patient is not nervous/anxious.        Objective:   Physical Exam Constitutional:      General: He is not in acute distress.    Appearance: Normal appearance. He is well-developed. He is obese. He is not ill-appearing or diaphoretic.  HENT:     Head: Normocephalic and atraumatic.  Eyes:     Conjunctiva/sclera: Conjunctivae normal.     Pupils: Pupils are equal, round, and reactive to light.  Neck:     Thyroid : No thyromegaly.  Vascular: No carotid bruit or JVD.  Cardiovascular:     Rate and Rhythm: Normal rate and regular rhythm.     Heart sounds: Normal heart sounds.     No gallop.  Pulmonary:     Effort: Pulmonary effort is normal. No respiratory distress.     Breath sounds: Normal breath sounds. No wheezing or rales.  Abdominal:     General: There is no distension or abdominal bruit.     Palpations: Abdomen is soft.  Musculoskeletal:     Cervical back: Normal range of motion and neck supple.     Right lower leg: No edema.     Left lower leg: No edema.  Lymphadenopathy:     Cervical: No cervical adenopathy.  Skin:    General: Skin is warm and dry.     Coloration: Skin is not pale.     Findings: No rash.  Neurological:     Mental Status: He is alert.     Coordination: Coordination normal.     Deep Tendon Reflexes: Reflexes are normal and symmetric. Reflexes normal.  Psychiatric:        Mood and Affect: Mood normal.           Assessment & Plan:   Problem List Items Addressed This Visit       Cardiovascular and Mediastinum   Essential hypertension - Primary   bp in fair control at this time -improved  BP Readings from Last 1 Encounters:  06/06/24 119/80   Plan to continue Hydrochlorothiazide  25 mg daily  Losartan  50 mg daily   No changes needed Most recent labs reviewed  Disc lifstyle change with low sodium diet and exercise        Relevant Medications   hydrochlorothiazide  (HYDRODIURIL ) 25 MG tablet   losartan  (COZAAR ) 50  MG tablet     Endocrine   Type 2 diabetes mellitus with obesity (HCC)   New Lab Results  Component Value Date   HGBA1C 7.3 (H) 03/22/2024   HGBA1C 6.1 01/12/2023   HGBA1C 5.9 11/06/2021   Plan to start metformin xr 500 mg bid  Discussed possible side effects  Reviewed goals for low glycemic diet  Declined referral to dm teaching or dm test equip yet Great exercise  Normal foot exam Will discuss statin at follow up  Will get microalbumin at follow up  On arb  Follow up in 3 months        Relevant Medications   losartan  (COZAAR ) 50 MG tablet   metFORMIN (GLUCOPHAGE-XR) 500 MG 24 hr tablet   Hyperlipidemia associated with type 2 diabetes mellitus (HCC)   Disc goals for lipids and reasons to control them Rev last labs with pt Rev low sat fat diet in detail Last LDL was 102 Goal for dm2 will be ynder 7-  Will discuss starting statin at next visit         Relevant Medications   hydrochlorothiazide  (HYDRODIURIL ) 25 MG tablet   losartan  (COZAAR ) 50 MG tablet   metFORMIN (GLUCOPHAGE-XR) 500 MG 24 hr tablet     Other   Morbid obesity (HCC)   Discussed how this problem influences overall health and the risks it imposes  Reviewed plan for weight loss with lower calorie diet (via better food choices (lower glycemic and portion control) along with exercise building up to or more than 30 minutes 5 days per week including some aerobic activity and strength training   Pt is open to starting metformin for new dm2  May consider glp-1 in future Great exercise habits  Large appetite Discussed limiting portions and working on lower glycemic choices        Relevant Medications   metFORMIN (GLUCOPHAGE-XR) 500 MG 24 hr tablet

## 2024-09-06 ENCOUNTER — Ambulatory Visit: Admitting: Family Medicine

## 2024-09-29 ENCOUNTER — Encounter: Payer: Self-pay | Admitting: Family Medicine

## 2024-09-29 ENCOUNTER — Ambulatory Visit (INDEPENDENT_AMBULATORY_CARE_PROVIDER_SITE_OTHER): Admitting: Family Medicine

## 2024-09-29 VITALS — BP 122/79 | HR 63 | Temp 98.7°F | Ht 72.0 in | Wt 307.0 lb

## 2024-09-29 DIAGNOSIS — Z79899 Other long term (current) drug therapy: Secondary | ICD-10-CM

## 2024-09-29 DIAGNOSIS — I10 Essential (primary) hypertension: Secondary | ICD-10-CM

## 2024-09-29 DIAGNOSIS — E1169 Type 2 diabetes mellitus with other specified complication: Secondary | ICD-10-CM | POA: Diagnosis not present

## 2024-09-29 DIAGNOSIS — E669 Obesity, unspecified: Secondary | ICD-10-CM

## 2024-09-29 DIAGNOSIS — E785 Hyperlipidemia, unspecified: Secondary | ICD-10-CM | POA: Diagnosis not present

## 2024-09-29 DIAGNOSIS — R7303 Prediabetes: Secondary | ICD-10-CM

## 2024-09-29 DIAGNOSIS — Z7984 Long term (current) use of oral hypoglycemic drugs: Secondary | ICD-10-CM

## 2024-09-29 LAB — MICROALBUMIN / CREATININE URINE RATIO
Creatinine,U: 98.2 mg/dL
Microalb Creat Ratio: UNDETERMINED mg/g (ref 0.0–30.0)
Microalb, Ur: <0.7 mg/dL

## 2024-09-29 LAB — POCT GLYCOSYLATED HEMOGLOBIN (HGB A1C): Hemoglobin A1C: 6.4 % — AB (ref 4.0–5.6)

## 2024-09-29 MED ORDER — ROSUVASTATIN CALCIUM 5 MG PO TABS: 5.0000 mg | ORAL_TABLET | Freq: Every day | ORAL | 3 refills | Status: AC

## 2024-09-29 NOTE — Progress Notes (Signed)
 Subjective:    Patient ID: Samuel Murray, male    DOB: 06/06/1976, 48 y.o.   MRN: [id number]  HPI  Wt Readings from Last 3 Encounters:  09/29/24 (!) 307 lb (139.3 kg)  06/06/24 (!) 306 lb 8 oz (139 kg)  05/16/24 (!) 305 lb 4 oz (138.5 kg)   41.64 kg/m  Vitals:   09/29/24 0850 09/29/24 0918  BP: (!) 146/84 122/79  Pulse: 63   Temp: 98.7 F (37.1 C)   SpO2: 98%      Pt presents for follow up of  DM2 HTN Hyperlipidemia Morbid obesity    HTN bp is stable today  No cp or palpitations or headaches or edema  No side effects to medicines  BP Readings from Last 3 Encounters:  09/29/24 122/79  06/06/24 119/80  05/16/24 (!) 142/92     Lab Results  Component Value Date   NA 139 03/22/2024   K 4.1 03/22/2024   CO2 32 03/22/2024   GLUCOSE 135 (H) 03/22/2024   BUN 23 03/22/2024   CREATININE 1.08 03/22/2024   CALCIUM 9.1 03/22/2024   GFR 80.46 03/22/2024   GFRNONAA 66.53 02/11/2010   Hydrochlorothiazide  25 mg daily  Losartan  50 mg daily   DM2 Lab Results  Component Value Date   HGBA1C 6.4 (A) 09/29/2024   HGBA1C 7.3 (H) 03/22/2024   HGBA1C 6.1 01/12/2023   Last visit started metformin  xr 500 mg bid   Tolerating well  Eyesight is a bit blurry  Eye exam is due soon   Eating less  Eating better   Cottage cheese  Yogurt  Small sandwich - sour dough bread  Chicken    (less red meat)  Lot of salads   Not eating fruits or sweets as a rule  Lots of lean protein   Not feeling hungry all the time   Exercise  5 days per week  90 minutes  Strength training and cardio    Due for microalb  Hyperlipidemia Lab Results  Component Value Date   CHOL 160 03/22/2024   HDL 29.00 (L) 03/22/2024   LDLCALC 102 (H) 03/22/2024   LDLDIRECT 125.0 01/12/2023   TRIG 146.0 03/22/2024   CHOLHDL 6 03/22/2024   The 10-year ASCVD risk score (Arnett DK, et al., 2019) is: 9.1%   Values used to calculate the score:     Age: 29 years     Clincally relevant sex:  Male     Is Non-Hispanic African American: No     Diabetic: Yes     Tobacco smoker: No     Systolic Blood Pressure: 122 mmHg     Is BP treated: Yes     HDL Cholesterol: 29 mg/dL     Total Cholesterol: 160 mg/dL    Patient Active Problem List   Diagnosis Date Noted   Hyperlipidemia associated with type 2 diabetes mellitus (HCC) 06/06/2024   Prostate cancer screening 03/22/2024   Adenomatous polyp of colon 04/16/2023   Current use of proton pump inhibitor 01/12/2023   Screening for colon cancer 11/06/2021   Type 2 diabetes mellitus in patient with obesity (HCC) 09/20/2019   Foot pain, bilateral 05/19/2018   DDD (degenerative disc disease), lumbar 05/12/2017   Low HDL (under 40) 04/22/2016   Routine general medical examination at a health care facility 12/06/2015   Anxiety and depression 03/13/2015   Essential hypertension 03/13/2015   Gout 03/06/2015   GERD (gastroesophageal reflux disease) 02/05/2013   Morbid obesity (HCC) 03/01/2008  Attention deficit disorder 02/01/2008   Allergic rhinitis 02/01/2008   Asthma 02/01/2008   Past Medical History:  Diagnosis Date   Allergic rhinitis    Asthma    Attention deficit disorder 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    Depression    Elevated blood pressure reading without diagnosis of hypertension    Obesity, unspecified    Past Surgical History:  Procedure Laterality Date   ANKLE SURGERY     COLONOSCOPY WITH PROPOFOL  N/A 04/16/2023   Procedure: COLONOSCOPY WITH PROPOFOL ;  Surgeon: Therisa Bi, MD;  Location: [facility] ENDOSCOPY;  Service: Gastroenterology;  Laterality: N/A;   TONSILLECTOMY AND ADENOIDECTOMY     Social History   Tobacco Use   Smoking status: Former    Current packs/day: 0.00    Types: Cigarettes    Quit date: 12/28/2000    Years since quitting: 23.7   Smokeless tobacco: Never  Vaping Use   Vaping status: Never Used  Substance Use Topics   Alcohol use: No    Alcohol/week: 0.0 standard drinks of alcohol   Drug  use: No   Family History  Problem Relation Age of Onset   Hypertension Father    Other Father        heart problems   Heart disease Father    Hypothyroidism Sister    Thyroid  disease Sister        hypothyroid   Allergies  Allergen Reactions   Escitalopram Oxalate     REACTION: sedation   Prozac  [Fluoxetine  Hcl]     Wt gain    Current Outpatient Medications on File Prior to Visit  Medication Sig Dispense Refill   albuterol  (VENTOLIN  HFA) 108 (90 Base) MCG/ACT inhaler Inhale 2 puffs into the lungs every 4 (four) hours as needed. 54 g 1   allopurinol  (ZYLOPRIM ) 100 MG tablet Take 1 tablet (100 mg total) by mouth daily. 90 tablet 3   colchicine  0.6 MG tablet Take 1 tablet (0.6 mg total) by mouth 2 (two) times daily as needed (for gout flare). 30 tablet 3   fluticasone  (FLONASE ) 50 MCG/ACT nasal spray USE 2 SPRAYS IN EACH NOSTRIL EVERY DAY 48 g 3   hydrochlorothiazide  (HYDRODIURIL ) 25 MG tablet Take 1 tablet (25 mg total) by mouth daily. 90 tablet 3   lansoprazole  (PREVACID ) 30 MG capsule Take 1 capsule (30 mg total) by mouth daily. 90 capsule 3   losartan  (COZAAR ) 50 MG tablet Take 1 tablet (50 mg total) by mouth daily. 90 tablet 3   metFORMIN  (GLUCOPHAGE -XR) 500 MG 24 hr tablet Take 1 tablet (500 mg total) by mouth 2 (two) times daily with a meal. 180 tablet 1   sertraline  (ZOLOFT ) 100 MG tablet Take 1 tablet (100 mg total) by mouth daily. 90 tablet 3   No current facility-administered medications on file prior to visit.    Review of Systems  Constitutional:  Positive for unexpected weight change. Negative for activity change, appetite change, fatigue and fever.  HENT:  Negative for congestion, rhinorrhea, sore throat and trouble swallowing.   Eyes:  Negative for pain, redness, itching and visual disturbance.  Respiratory:  Negative for cough, chest tightness, shortness of breath and wheezing.   Cardiovascular:  Negative for chest pain and palpitations.  Gastrointestinal:   Negative for abdominal pain, blood in stool, constipation, diarrhea and nausea.  Endocrine: Negative for cold intolerance, heat intolerance, polydipsia and polyuria.  Genitourinary:  Negative for difficulty urinating, dysuria, frequency and urgency.  Musculoskeletal:  Negative for arthralgias, joint swelling and myalgias.  Skin:  Negative for pallor and rash.  Neurological:  Negative for dizziness, tremors, weakness, numbness and headaches.  Hematological:  Negative for adenopathy. Does not bruise/bleed easily.  Psychiatric/Behavioral:  Negative for decreased concentration and dysphoric mood. The patient is not nervous/anxious.        Objective:   Physical Exam Constitutional:      General: He is not in acute distress.    Appearance: Normal appearance. He is well-developed. He is obese. He is not ill-appearing or diaphoretic.     Comments: Large frame  Muscular build   HENT:     Head: Normocephalic and atraumatic.     Mouth/Throat:     Mouth: Mucous membranes are moist.  Eyes:     Conjunctiva/sclera: Conjunctivae normal.     Pupils: Pupils are equal, round, and reactive to light.  Neck:     Thyroid : No thyromegaly.     Vascular: No carotid bruit or JVD.  Cardiovascular:     Rate and Rhythm: Normal rate and regular rhythm.     Heart sounds: Normal heart sounds.     No gallop.  Pulmonary:     Effort: Pulmonary effort is normal. No respiratory distress.     Breath sounds: Normal breath sounds. No wheezing or rales.  Abdominal:     General: There is no distension or abdominal bruit.     Palpations: Abdomen is soft.  Musculoskeletal:     Cervical back: Normal range of motion and neck supple.     Right lower leg: No edema.     Left lower leg: No edema.  Lymphadenopathy:     Cervical: No cervical adenopathy.  Skin:    General: Skin is warm and dry.     Coloration: Skin is not pale.     Findings: No rash.  Neurological:     Mental Status: He is alert.     Coordination:  Coordination normal.     Deep Tendon Reflexes: Reflexes are normal and symmetric. Reflexes normal.  Psychiatric:        Mood and Affect: Mood normal.           Assessment & Plan:   Problem List Items Addressed This Visit       Cardiovascular and Mediastinum   Essential hypertension - Primary   bp in fair control at this time -improved  BP Readings from Last 1 Encounters:  09/29/24 122/79   Plan to continue Hydrochlorothiazide  25 mg daily  Losartan  50 mg daily   No changes needed If blood pressure trends up , would consider change to different arb Most recent labs reviewed  Disc lifstyle change with low sodium diet and exercise        Relevant Medications   rosuvastatin (CRESTOR) 5 MG tablet     Endocrine   Type 2 diabetes mellitus in patient with obesity (HCC)   Lab Results  Component Value Date   HGBA1C 6.4 (A) 09/29/2024   HGBA1C 7.3 (H) 03/22/2024   HGBA1C 6.1 01/12/2023   Improved with metformin  xr 500 mg bid  Eating well  Great exercise  Still struggling to loose weight   Microal today  Starting crestor 5 mg daily  Planning dm eye exam Normal foot exam   Discussed GLP-1 to help with glucose control and weight  Disc option of GLP medication including possible side effects like GI intolerance and risk of thyroid  and endocrine cancer, pancreatitis and gallstones, kidney problems and diabetic retinopathy       Relevant  Medications   rosuvastatin (CRESTOR) 5 MG tablet   Other Relevant Orders   POCT HgB A1C (Completed)   Microalbumin / creatinine urine ratio (Completed)   Hyperlipidemia associated with type 2 diabetes mellitus (HCC)   Disc goals for lipids and reasons to control them Rev last labs with pt Rev low sat fat diet in detail Last LDL 102   Discussed recommendation for statin in diabetics and reason for use Prescription rosuvastatin 5 mg daily  Encouraged to call if side effects Take in evening  Re check lipid 6 wk       Relevant  Medications   rosuvastatin (CRESTOR) 5 MG tablet     Other   Morbid obesity (HCC)   Despite calorie decrease and good exercise Interested in trial of glp-1 if covered  Discussed how this problem influences overall health and the risks it imposes  Reviewed plan for weight loss with lower calorie diet (via better food choices (lower glycemic and portion control) along with exercise building up to or more than 30 minutes 5 days per week including some aerobic activity and strength training     Disc option of GLP medication including possible side effects like GI intolerance and risk of thyroid  and endocrine cancer, pancreatitis and gallstones, kidney problems and diabetic retinopathy

## 2024-09-29 NOTE — Assessment & Plan Note (Signed)
 Disc goals for lipids and reasons to control them Rev last labs with pt Rev low sat fat diet in detail Last LDL 102   Discussed recommendation for statin in diabetics and reason for use Prescription rosuvastatin 5 mg daily  Encouraged to call if side effects Take in evening  Re check lipid 6 wk

## 2024-09-29 NOTE — Patient Instructions (Addendum)
 Urine microalbumin test today  Get a diabetic eye exam    Continue current medicines  Start generic crestor 5 mg daily in evening  If any side effects like muscle pain let us  know   Schedule cholesterol labs in 6 weeks   Let us  know when your pharmacy has new insurance info  I want to see if we can get mounjaro covered (or other GLP-1)

## 2024-09-29 NOTE — Assessment & Plan Note (Signed)
 Lab Results  Component Value Date   HGBA1C 6.4 (A) 09/29/2024   HGBA1C 7.3 (H) 03/22/2024   HGBA1C 6.1 01/12/2023   Improved with metformin  xr 500 mg bid  Eating well  Great exercise  Still struggling to loose weight   Microal today  Starting crestor 5 mg daily  Planning dm eye exam Normal foot exam   Discussed GLP-1 to help with glucose control and weight  Disc option of GLP medication including possible side effects like GI intolerance and risk of thyroid  and endocrine cancer, pancreatitis and gallstones, kidney problems and diabetic retinopathy

## 2024-09-29 NOTE — Assessment & Plan Note (Signed)
 bp in fair control at this time -improved  BP Readings from Last 1 Encounters:  09/29/24 122/79   Plan to continue Hydrochlorothiazide  25 mg daily  Losartan  50 mg daily   No changes needed If blood pressure trends up , would consider change to different arb Most recent labs reviewed  Disc lifstyle change with low sodium diet and exercise

## 2024-09-30 ENCOUNTER — Ambulatory Visit: Payer: Self-pay | Admitting: Family Medicine

## 2024-09-30 NOTE — Assessment & Plan Note (Signed)
 Despite calorie decrease and good exercise Interested in trial of glp-1 if covered  Discussed how this problem influences overall health and the risks it imposes  Reviewed plan for weight loss with lower calorie diet (via better food choices (lower glycemic and portion control) along with exercise building up to or more than 30 minutes 5 days per week including some aerobic activity and strength training     Disc option of GLP medication including possible side effects like GI intolerance and risk of thyroid  and endocrine cancer, pancreatitis and gallstones, kidney problems and diabetic retinopathy

## 2024-10-01 ENCOUNTER — Encounter: Payer: Self-pay | Admitting: Family Medicine

## 2024-10-02 MED ORDER — TIRZEPATIDE 2.5 MG/0.5ML ~~LOC~~ SOAJ: 2.5000 mg | SUBCUTANEOUS | 0 refills | Status: DC

## 2024-10-03 ENCOUNTER — Telehealth: Payer: Self-pay | Admitting: Pharmacy Technician

## 2024-10-03 ENCOUNTER — Other Ambulatory Visit (HOSPITAL_COMMUNITY): Payer: Self-pay

## 2024-10-03 NOTE — Telephone Encounter (Signed)
 Pharmacy Patient Advocate Encounter  Received notification from [facility] that Prior Authorization for Mounjaro 2.5MG /0.5ML auto-injectors  has been APPROVED from 10/03/24 to 10/03/27. Unable to obtain price due to refill too soon rejection, last fill date 10/03/24 next available fill date 10/25/24   PA #/Case ID/Reference #: [id number]

## 2024-10-03 NOTE — Telephone Encounter (Signed)
 Pharmacy Patient Advocate Encounter   Received notification from CoverMyMeds that prior authorization for Mounjaro 2.5MG /0.5ML auto-injectors  is required/requested.   Insurance verification completed.   The patient is insured through [facility].   Per test claim: PA required; PA submitted to above mentioned insurance via Latent Key/confirmation #/EOC [id number] Status is pending

## 2024-10-05 NOTE — Telephone Encounter (Signed)
 Please see if I have any openings that day, thanks

## 2024-10-05 NOTE — Telephone Encounter (Addendum)
 Please change lab appt to f/u with PCP, we will just do labs after his appt with PCP (please f/u with pt to confirm the change also)

## 2024-10-09 ENCOUNTER — Telehealth: Payer: Self-pay | Admitting: *Deleted

## 2024-10-09 NOTE — Telephone Encounter (Signed)
 Left VM letting pt know that his labs need to be fasting

## 2024-10-09 NOTE — Telephone Encounter (Signed)
 Copied from CRM [id number]. Topic: Clinical - Medical Advice >> Oct 09, 2024  1:34 PM Suzen RAMAN wrote: Reason for CRM: Patient would like to know if upcoming labs are fasting .SABRA?

## 2024-10-17 ENCOUNTER — Encounter: Payer: Self-pay | Admitting: Family Medicine

## 2024-10-17 ENCOUNTER — Ambulatory Visit: Admitting: Family Medicine

## 2024-10-17 VITALS — BP 136/88 | HR 68 | Temp 98.8°F | Ht 72.0 in | Wt 304.1 lb

## 2024-10-17 DIAGNOSIS — I10 Essential (primary) hypertension: Secondary | ICD-10-CM

## 2024-10-17 DIAGNOSIS — E785 Hyperlipidemia, unspecified: Secondary | ICD-10-CM

## 2024-10-17 DIAGNOSIS — E669 Obesity, unspecified: Secondary | ICD-10-CM

## 2024-10-17 DIAGNOSIS — Z79899 Other long term (current) drug therapy: Secondary | ICD-10-CM | POA: Diagnosis not present

## 2024-10-17 DIAGNOSIS — E1169 Type 2 diabetes mellitus with other specified complication: Secondary | ICD-10-CM

## 2024-10-17 DIAGNOSIS — Z7985 Long-term (current) use of injectable non-insulin antidiabetic drugs: Secondary | ICD-10-CM

## 2024-10-17 MED ORDER — TIRZEPATIDE 5 MG/0.5ML ~~LOC~~ SOAJ: 5.0000 mg | SUBCUTANEOUS | 0 refills | Status: DC

## 2024-10-17 NOTE — Assessment & Plan Note (Signed)
 bp in fair control at this time -improved  BP Readings from Last 1 Encounters:  10/17/24 136/88   Plan to continue Hydrochlorothiazide  25 mg daily  Losartan  50 mg daily   No changes needed If blood pressure trends up , would consider change to different arb Most recent labs reviewed  Disc lifstyle change with low sodium diet and exercise

## 2024-10-17 NOTE — Assessment & Plan Note (Signed)
 Lab Results  Component Value Date   HGBA1C 6.4 (A) 09/29/2024   HGBA1C 7.3 (H) 03/22/2024   HGBA1C 6.1 01/12/2023   Microalb utd Moujaro 2.5 mg weekly- tolerating well so far  Eating less and eating better/ per pt weight down 3 lb  On arb and statin  At end of first mo will go up to 5 mg weekly   Continues metformin  xr 500 mg daily   Follow up after jan3

## 2024-10-17 NOTE — Patient Instructions (Addendum)
 Keep eating well  Keep strength training   We will go up to 5 mg for mounjaro in early November   I sent the new prescription to your pharmacy to start after the first month is done If any problems tolerating it let us  know   Schedule fasting labs for mid November for cholesterol   Follow up for diabetes visit after jan 3    If you get to the point where you cannot control heartburn or other acid reflux symptoms with diet - then start back on prevacid  regularly

## 2024-10-17 NOTE — Assessment & Plan Note (Signed)
 Disc goals for lipids and reasons to control them Rev last labs with pt Rev low sat fat diet in detail Tolerating crestor 5 mg daily  Plan fasting labs for mid nov

## 2024-10-17 NOTE — Assessment & Plan Note (Signed)
 Discussed how this problem influences overall health and the risks it imposes  Reviewed plan for weight loss with lower calorie diet (via better food choices (lower glycemic and portion control) along with exercise building up to or more than 30 minutes 5 days per week including some aerobic activity and strength training   Adjusting to mounjaro 2.5 and will increase to 5 mg at end of this month prescription  Per pt weight is down 3 lb Eating better ad eating less Commended exercise

## 2024-10-17 NOTE — Assessment & Plan Note (Signed)
 Pt has been able to cut back to twice weekly (so far)   Encouraged to avoid triggers

## 2024-10-17 NOTE — Progress Notes (Signed)
 Subjective:    Patient ID: Samuel Murray, male    DOB: November 13, 1976, 48 y.o.   MRN: [id number]  HPI  Wt Readings from Last 3 Encounters:  10/17/24 (!) 304 lb 2 oz (138 kg)  09/29/24 (!) 307 lb (139.3 kg)  06/06/24 (!) 306 lb 8 oz (139 kg)   41.25 kg/m  Vitals:   10/17/24 0759  BP: 136/88  Pulse: 68  Temp: 98.8 F (37.1 C)  SpO2: 96%    Pt presents for follow up of  DM2 HTN  Hyperlipidemia Obesity    HTN bp is stable today  No cp or palpitations or headaches or edema  No side effects to medicines  BP Readings from Last 3 Encounters:  10/17/24 136/88  09/29/24 122/79  06/06/24 119/80    Hydrochlorothiazide  25 mg daily  Losartan  50 mg daily   Lab Results  Component Value Date   NA 139 03/22/2024   K 4.1 03/22/2024   CO2 32 03/22/2024   GLUCOSE 135 (H) 03/22/2024   BUN 23 03/22/2024   CREATININE 1.08 03/22/2024   CALCIUM 9.1 03/22/2024   GFR 80.46 03/22/2024   GFRNONAA 66.53 02/11/2010    DM2 Diabetes No symptoms of low glucose   DM diet -better  Exercise -daily  Not as much cardio and more strength training  A lot of functional lifting also  Eating protein      Metformin  xr 500 mg daily  Mounjaro 2.5 mg weekly   Feels good  Tolerates medicine so far Thinks he lost 3 lb so far   Less hunger and gets full fasting  Has decreased his cravings for sweets   No n/v No constipation or diarrhea    Lab Results  Component Value Date   HGBA1C 6.4 (A) 09/29/2024   HGBA1C 7.3 (H) 03/22/2024   HGBA1C 6.1 01/12/2023   Lab Results  Component Value Date   MICROALBUR <0.7 09/29/2024    Renal protection-arb On statin  Last eye exam   Hyperlipidemia Lab Results  Component Value Date   CHOL 160 03/22/2024   HDL 29.00 (L) 03/22/2024   LDLCALC 102 (H) 03/22/2024   LDLDIRECT 125.0 01/12/2023   TRIG 146.0 03/22/2024   CHOLHDL 6 03/22/2024   Started crestor 10/3  No side effects    GERD Prevacid  down to 2 times weekly    Patient  Active Problem List   Diagnosis Date Noted   Hyperlipidemia associated with type 2 diabetes mellitus (HCC) 06/06/2024   Prostate cancer screening 03/22/2024   Adenomatous polyp of colon 04/16/2023   Current use of proton pump inhibitor 01/12/2023   Screening for colon cancer 11/06/2021   Type 2 diabetes mellitus in patient with obesity (HCC) 09/20/2019   Foot pain, bilateral 05/19/2018   DDD (degenerative disc disease), lumbar 05/12/2017   Low HDL (under 40) 04/22/2016   Routine general medical examination at a health care facility 12/06/2015   Anxiety and depression 03/13/2015   Essential hypertension 03/13/2015   Gout 03/06/2015   GERD (gastroesophageal reflux disease) 02/05/2013   Morbid obesity (HCC) 03/01/2008   Attention deficit disorder 02/01/2008   Allergic rhinitis 02/01/2008   Asthma 02/01/2008   Past Medical History:  Diagnosis Date   Allergic rhinitis    Asthma    Attention deficit disorder 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    Depression    Elevated blood pressure reading without diagnosis of hypertension    Obesity, unspecified    Past Surgical History:  Procedure Laterality Date  ANKLE SURGERY     COLONOSCOPY WITH PROPOFOL  N/A 04/16/2023   Procedure: COLONOSCOPY WITH PROPOFOL ;  Surgeon: Therisa Bi, MD;  Location: [facility] ENDOSCOPY;  Service: Gastroenterology;  Laterality: N/A;   TONSILLECTOMY AND ADENOIDECTOMY     Social History   Tobacco Use   Smoking status: Former    Current packs/day: 0.00    Types: Cigarettes    Quit date: 12/28/2000    Years since quitting: 23.8   Smokeless tobacco: Never  Vaping Use   Vaping status: Never Used  Substance Use Topics   Alcohol use: No    Alcohol/week: 0.0 standard drinks of alcohol   Drug use: No   Family History  Problem Relation Age of Onset   Hypertension Father    Other Father        heart problems   Heart disease Father    Hypothyroidism Sister    Thyroid  disease Sister        hypothyroid   Allergies   Allergen Reactions   Escitalopram Oxalate     REACTION: sedation   Prozac  [Fluoxetine  Hcl]     Wt gain    Current Outpatient Medications on File Prior to Visit  Medication Sig Dispense Refill   albuterol  (VENTOLIN  HFA) 108 (90 Base) MCG/ACT inhaler Inhale 2 puffs into the lungs every 4 (four) hours as needed. 54 g 1   allopurinol  (ZYLOPRIM ) 100 MG tablet Take 1 tablet (100 mg total) by mouth daily. 90 tablet 3   colchicine  0.6 MG tablet Take 1 tablet (0.6 mg total) by mouth 2 (two) times daily as needed (for gout flare). 30 tablet 3   fluticasone  (FLONASE ) 50 MCG/ACT nasal spray USE 2 SPRAYS IN EACH NOSTRIL EVERY DAY 48 g 3   hydrochlorothiazide  (HYDRODIURIL ) 25 MG tablet Take 1 tablet (25 mg total) by mouth daily. 90 tablet 3   lansoprazole  (PREVACID ) 30 MG capsule Take 1 capsule (30 mg total) by mouth daily. 90 capsule 3   losartan  (COZAAR ) 50 MG tablet Take 1 tablet (50 mg total) by mouth daily. 90 tablet 3   metFORMIN  (GLUCOPHAGE -XR) 500 MG 24 hr tablet Take 1 tablet (500 mg total) by mouth 2 (two) times daily with a meal. 180 tablet 1   rosuvastatin (CRESTOR) 5 MG tablet Take 1 tablet (5 mg total) by mouth daily. 90 tablet 3   sertraline  (ZOLOFT ) 100 MG tablet Take 1 tablet (100 mg total) by mouth daily. 90 tablet 3   tirzepatide (MOUNJARO) 2.5 MG/0.5ML Pen Inject 2.5 mg into the skin once a week. 2 mL 0   No current facility-administered medications on file prior to visit.    Review of Systems  Constitutional:  Negative for activity change, appetite change, fatigue, fever and unexpected weight change.  HENT:  Negative for congestion, rhinorrhea, sore throat and trouble swallowing.   Eyes:  Negative for pain, redness, itching and visual disturbance.  Respiratory:  Negative for cough, chest tightness, shortness of breath and wheezing.   Cardiovascular:  Negative for chest pain and palpitations.  Gastrointestinal:  Negative for abdominal pain, blood in stool, constipation, diarrhea  and nausea.  Endocrine: Negative for cold intolerance, heat intolerance, polydipsia and polyuria.  Genitourinary:  Negative for difficulty urinating, dysuria, frequency and urgency.  Musculoskeletal:  Negative for arthralgias, joint swelling and myalgias.  Skin:  Negative for pallor and rash.  Neurological:  Negative for dizziness, tremors, weakness, numbness and headaches.  Hematological:  Negative for adenopathy. Does not bruise/bleed easily.  Psychiatric/Behavioral:  Negative for decreased  concentration and dysphoric mood. The patient is not nervous/anxious.        Objective:   Physical Exam Constitutional:      General: He is not in acute distress.    Appearance: Normal appearance. He is well-developed. He is obese. He is not ill-appearing or diaphoretic.  HENT:     Head: Normocephalic and atraumatic.  Eyes:     Conjunctiva/sclera: Conjunctivae normal.     Pupils: Pupils are equal, round, and reactive to light.  Neck:     Thyroid : No thyromegaly.     Vascular: No carotid bruit or JVD.  Cardiovascular:     Rate and Rhythm: Normal rate and regular rhythm.     Heart sounds: Normal heart sounds.     No gallop.  Pulmonary:     Effort: Pulmonary effort is normal. No respiratory distress.     Breath sounds: Normal breath sounds. No wheezing or rales.  Abdominal:     General: There is no distension or abdominal bruit.     Palpations: Abdomen is soft.  Musculoskeletal:     Cervical back: Normal range of motion and neck supple.     Right lower leg: No edema.     Left lower leg: No edema.  Lymphadenopathy:     Cervical: No cervical adenopathy.  Skin:    General: Skin is warm and dry.     Coloration: Skin is not pale.     Findings: No rash.  Neurological:     Mental Status: He is alert.     Coordination: Coordination normal.     Deep Tendon Reflexes: Reflexes are normal and symmetric. Reflexes normal.  Psychiatric:        Mood and Affect: Mood normal.            Assessment & Plan:   Problem List Items Addressed This Visit       Cardiovascular and Mediastinum   Essential hypertension   bp in fair control at this time -improved  BP Readings from Last 1 Encounters:  10/17/24 136/88   Plan to continue Hydrochlorothiazide  25 mg daily  Losartan  50 mg daily   No changes needed If blood pressure trends up , would consider change to different arb Most recent labs reviewed  Disc lifstyle change with low sodium diet and exercise          Endocrine   Type 2 diabetes mellitus in patient with obesity (HCC) - Primary   Lab Results  Component Value Date   HGBA1C 6.4 (A) 09/29/2024   HGBA1C 7.3 (H) 03/22/2024   HGBA1C 6.1 01/12/2023   Microalb utd Moujaro 2.5 mg weekly- tolerating well so far  Eating less and eating better/ per pt weight down 3 lb  On arb and statin  At end of first mo will go up to 5 mg weekly   Continues metformin  xr 500 mg daily   Follow up after jan3       Relevant Medications   tirzepatide (MOUNJARO) 5 MG/0.5ML Pen   Hyperlipidemia associated with type 2 diabetes mellitus (HCC)   Disc goals for lipids and reasons to control them Rev last labs with pt Rev low sat fat diet in detail Tolerating crestor 5 mg daily  Plan fasting labs for mid nov        Relevant Medications   tirzepatide (MOUNJARO) 5 MG/0.5ML Pen     Other   Morbid obesity (HCC)   Discussed how this problem influences overall health and the risks it  imposes  Reviewed plan for weight loss with lower calorie diet (via better food choices (lower glycemic and portion control) along with exercise building up to or more than 30 minutes 5 days per week including some aerobic activity and strength training   Adjusting to mounjaro 2.5 and will increase to 5 mg at end of this month prescription  Per pt weight is down 3 lb Eating better ad eating less Commended exercise       Relevant Medications   tirzepatide (MOUNJARO) 5 MG/0.5ML Pen   Current use  of proton pump inhibitor   Pt has been able to cut back to twice weekly (so far)   Encouraged to avoid triggers

## 2024-10-23 ENCOUNTER — Other Ambulatory Visit

## 2024-11-13 ENCOUNTER — Other Ambulatory Visit

## 2024-11-13 DIAGNOSIS — E1169 Type 2 diabetes mellitus with other specified complication: Secondary | ICD-10-CM

## 2024-11-13 DIAGNOSIS — E785 Hyperlipidemia, unspecified: Secondary | ICD-10-CM | POA: Diagnosis not present

## 2024-11-13 LAB — LIPID PANEL
Cholesterol: 105 mg/dL (ref 0–200)
HDL: 31.8 mg/dL — ABNORMAL LOW (ref >39.00)
LDL Cholesterol: 56 mg/dL (ref 0–99)
NonHDL: 73.11
Total CHOL/HDL Ratio: 3
Triglycerides: 85 mg/dL (ref 0.0–149.0)
VLDL: 17 mg/dL (ref 0.0–40.0)

## 2024-11-13 LAB — ALT: ALT: 52 U/L (ref 0–53)

## 2024-11-13 LAB — AST: AST: 27 U/L (ref 0–37)

## 2024-11-15 ENCOUNTER — Other Ambulatory Visit

## 2024-11-21 MED ORDER — TIRZEPATIDE 7.5 MG/0.5ML ~~LOC~~ SOAJ: 7.5000 mg | SUBCUTANEOUS | 0 refills | Status: DC

## 2024-11-26 ENCOUNTER — Encounter: Payer: Self-pay | Admitting: Family Medicine

## 2024-11-27 MED ORDER — METFORMIN HCL ER 500 MG PO TB24: 500.0000 mg | ORAL_TABLET | Freq: Two times a day (BID) | ORAL | 2 refills | Status: AC

## 2024-12-20 ENCOUNTER — Encounter: Payer: Self-pay | Admitting: Family Medicine

## 2024-12-20 NOTE — Telephone Encounter (Signed)
 Last filled on 11/21/24 #46mL / 0 refills  Please see pt's message asking for 90 day Rx  F/u scheduled on 01/01/25

## 2024-12-20 NOTE — Telephone Encounter (Signed)
 Do you want to go up on the dose? How are you tolerating it?

## 2024-12-22 MED ORDER — TIRZEPATIDE 7.5 MG/0.5ML ~~LOC~~ SOAJ: 7.5000 mg | SUBCUTANEOUS | 0 refills | Status: DC

## 2024-12-27 MED ORDER — TIRZEPATIDE 10 MG/0.5ML ~~LOC~~ SOAJ: 10.0000 mg | SUBCUTANEOUS | 0 refills | Status: DC

## 2024-12-27 NOTE — Addendum Note (Signed)
 Addended by: RANDEEN HARDY A on: 12/27/2024 03:07 PM   Modules accepted: Orders

## 2025-01-01 ENCOUNTER — Ambulatory Visit: Payer: Self-pay | Admitting: Family Medicine

## 2025-01-01 ENCOUNTER — Encounter: Payer: Self-pay | Admitting: Family Medicine

## 2025-01-01 VITALS — BP 133/80 | HR 92 | Temp 98.2°F | Ht 72.0 in | Wt 287.0 lb

## 2025-01-01 DIAGNOSIS — E1169 Type 2 diabetes mellitus with other specified complication: Secondary | ICD-10-CM | POA: Diagnosis not present

## 2025-01-01 DIAGNOSIS — E785 Hyperlipidemia, unspecified: Secondary | ICD-10-CM | POA: Diagnosis not present

## 2025-01-01 DIAGNOSIS — Z7985 Long-term (current) use of injectable non-insulin antidiabetic drugs: Secondary | ICD-10-CM

## 2025-01-01 DIAGNOSIS — E669 Obesity, unspecified: Secondary | ICD-10-CM

## 2025-01-01 DIAGNOSIS — Z7984 Long term (current) use of oral hypoglycemic drugs: Secondary | ICD-10-CM

## 2025-01-01 DIAGNOSIS — Z0189 Encounter for other specified special examinations: Secondary | ICD-10-CM | POA: Diagnosis not present

## 2025-01-01 DIAGNOSIS — E119 Type 2 diabetes mellitus without complications: Secondary | ICD-10-CM | POA: Diagnosis not present

## 2025-01-01 DIAGNOSIS — I10 Essential (primary) hypertension: Secondary | ICD-10-CM | POA: Diagnosis not present

## 2025-01-01 LAB — POCT GLYCOSYLATED HEMOGLOBIN (HGB A1C): Hemoglobin A1C: 5.7 % — AB (ref 4.0–5.6)

## 2025-01-01 NOTE — Assessment & Plan Note (Signed)
 Normal exam Good foot care

## 2025-01-01 NOTE — Assessment & Plan Note (Signed)
 Disc goals for lipids and reasons to control them Rev last labs with pt Rev low sat fat diet in detail Tolerating crestor  5 mg daily   LDL of 56 at goal HDL low despite good exercise

## 2025-01-01 NOTE — Assessment & Plan Note (Signed)
 Lab Results  Component Value Date   HGBA1C 5.7 (A) 01/01/2025   HGBA1C 6.4 (A) 09/29/2024   HGBA1C 7.3 (H) 03/22/2024   Lab Results  Component Value Date   MICROALBUR <0.7 09/29/2024     Continues moujjaro-to increase to 10 mg weekly later this week Metformin  xr 500 mg bid   Doing well  Also with weight loss  Normal foot exam  Planning his eye exam  On arb and statin   Follow up 3 mo

## 2025-01-01 NOTE — Assessment & Plan Note (Signed)
 Losing weight with help of glp-1 for DM  Bmi down to 38.92 Co morbidities of HTN, DM2, hyperlipidemia -all in good control Discussed how this problem influences overall health and the risks it imposes  Reviewed plan for weight loss with lower calorie diet (via better food choices (lower glycemic and portion control) along with exercise building up to or more than 30 minutes 5 days per week including some aerobic activity and strength training    Encouraged to keep up the good work- happy with progress

## 2025-01-01 NOTE — Assessment & Plan Note (Signed)
 bp in fair control at this time -improved  BP Readings from Last 1 Encounters:  01/01/25 133/80   Plan to continue Hydrochlorothiazide  25 mg daily  Losartan  50 mg daily   No changes needed If blood pressure trends up , would consider change to different arb Most recent labs reviewed  Disc lifstyle change with low sodium diet and exercise

## 2025-01-01 NOTE — Progress Notes (Signed)
 "  Subjective:    Patient ID: Samuel Murray, male    DOB: 1976-04-23, 49 y.o.   MRN: [id number]  HPI  Wt Readings from Last 3 Encounters:  01/01/25 287 lb (130.2 kg)  10/17/24 (!) 304 lb 2 oz (138 kg)  09/29/24 (!) 307 lb (139.3 kg)   38.92 kg/m  Vitals:   01/01/25 1135 01/01/25 1201  BP: (!) 144/96 133/80  Pulse: 92   Temp: 98.2 F (36.8 C)   SpO2: 97%     Pt presents for follow up of  DM2 HTN Obesity   HTN bp is stable today  No cp or palpitations or headaches or edema  No side effects to medicines  BP Readings from Last 3 Encounters:  01/01/25 133/80  10/17/24 136/88  09/29/24 122/79     Lab Results  Component Value Date   NA 139 03/22/2024   K 4.1 03/22/2024   CO2 32 03/22/2024   GLUCOSE 135 (H) 03/22/2024   BUN 23 03/22/2024   CREATININE 1.08 03/22/2024   CALCIUM  9.1 03/22/2024   GFR 80.46 03/22/2024   GFRNONAA 66.53 02/11/2010   Losartan  50 mg daily  Hydrochlorothiazide  25 mg daily   Blood pressure was normal at home    Dm2 A1c down to 5.7 Lab Results  Component Value Date   HGBA1C 5.7 (A) 01/01/2025   HGBA1C 6.4 (A) 09/29/2024   HGBA1C 7.3 (H) 03/22/2024   Mounjaro  10 mg weekly    (just went up from 7.5)- due for that dose on Thursday  Tolerating the 7.5 -nothing  Metformin  XR 500 mg bid   Cheated during the holidays Got down to 278 lb   Eating lots of protein  Less cardio and more strength training    Lab Results  Component Value Date   MICROALBUR <0.7 09/29/2024     Had flu shot  Due for foot exam  Eye exam - not yet   Hyperlipidemia Lab Results  Component Value Date   CHOL 105 11/13/2024   HDL 31.80 (L) 11/13/2024   LDLCALC 56 11/13/2024   LDLDIRECT 125.0 01/12/2023   TRIG 85.0 11/13/2024   CHOLHDL 3 11/13/2024   Cretor 5 mg daily   With weight loss Joints feel better  Less anxious also    Patient Active Problem List   Diagnosis Date Noted   Encounter for diabetic foot exam (HCC) 01/01/2025    Hyperlipidemia associated with type 2 diabetes mellitus (HCC) 06/06/2024   Prostate cancer screening 03/22/2024   Adenomatous polyp of colon 04/16/2023   Current use of proton pump inhibitor 01/12/2023   Screening for colon cancer 11/06/2021   Type 2 diabetes mellitus in patient with obesity (HCC) 09/20/2019   Foot pain, bilateral 05/19/2018   DDD (degenerative disc disease), lumbar 05/12/2017   Low HDL (under 40) 04/22/2016   Routine general medical examination at a health care facility 12/06/2015   Anxiety and depression 03/13/2015   Essential hypertension 03/13/2015   Gout 03/06/2015   GERD (gastroesophageal reflux disease) 02/05/2013   Morbid obesity (HCC) 03/01/2008   Attention deficit disorder 02/01/2008   Allergic rhinitis 02/01/2008   Asthma 02/01/2008   Past Medical History:  Diagnosis Date   Allergic rhinitis    Anxiety 10-16-2009   Asthma    Attention deficit disorder 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    Depression    Elevated blood pressure reading without diagnosis of hypertension    GERD (gastroesophageal reflux disease) 05-16-2004   Hypertension 05-16-2014   Obesity, unspecified  Past Surgical History:  Procedure Laterality Date   ANKLE SURGERY     COLONOSCOPY WITH PROPOFOL  N/A 04/16/2023   Procedure: COLONOSCOPY WITH PROPOFOL ;  Surgeon: Therisa Bi, MD;  Location: [facility] ENDOSCOPY;  Service: Gastroenterology;  Laterality: N/A;   TONSILLECTOMY AND ADENOIDECTOMY     Social History[1] Family History  Problem Relation Age of Onset   Hypertension Father    Other Father        heart problems   Heart disease Father    Hypothyroidism Sister    Thyroid  disease Sister        hypothyroid   Allergies[2] Medications Ordered Prior to Encounter[3]  Review of Systems  Constitutional:  Negative for activity change, appetite change, fatigue, fever and unexpected weight change.  HENT:  Negative for congestion, rhinorrhea, sore throat and trouble swallowing.   Eyes:   Negative for pain, redness, itching and visual disturbance.  Respiratory:  Negative for cough, chest tightness, shortness of breath and wheezing.   Cardiovascular:  Negative for chest pain and palpitations.  Gastrointestinal:  Negative for abdominal pain, blood in stool, constipation, diarrhea and nausea.  Endocrine: Negative for cold intolerance, heat intolerance, polydipsia and polyuria.  Genitourinary:  Negative for difficulty urinating, dysuria, frequency and urgency.  Musculoskeletal:  Negative for arthralgias, joint swelling and myalgias.  Skin:  Negative for pallor and rash.  Neurological:  Negative for dizziness, tremors, weakness, numbness and headaches.  Hematological:  Negative for adenopathy. Does not bruise/bleed easily.  Psychiatric/Behavioral:  Negative for decreased concentration and dysphoric mood. The patient is not nervous/anxious.        Objective:   Physical Exam Constitutional:      General: He is not in acute distress.    Appearance: Normal appearance. He is well-developed. He is obese. He is not ill-appearing or diaphoretic.  HENT:     Head: Normocephalic and atraumatic.  Eyes:     Conjunctiva/sclera: Conjunctivae normal.     Pupils: Pupils are equal, round, and reactive to light.  Neck:     Thyroid : No thyromegaly.     Vascular: No carotid bruit or JVD.  Cardiovascular:     Rate and Rhythm: Normal rate and regular rhythm.     Heart sounds: Normal heart sounds.     No gallop.  Pulmonary:     Effort: Pulmonary effort is normal. No respiratory distress.     Breath sounds: Normal breath sounds. No wheezing or rales.  Abdominal:     General: There is no distension or abdominal bruit.     Palpations: Abdomen is soft.  Musculoskeletal:     Cervical back: Normal range of motion and neck supple.     Right lower leg: No edema.     Left lower leg: No edema.  Lymphadenopathy:     Cervical: No cervical adenopathy.  Skin:    General: Skin is warm and dry.      Coloration: Skin is not pale.     Findings: No rash.  Neurological:     Mental Status: He is alert.     Sensory: No sensory deficit.     Coordination: Coordination normal.     Deep Tendon Reflexes: Reflexes are normal and symmetric. Reflexes normal.  Psychiatric:        Mood and Affect: Mood normal.           Assessment & Plan:   Problem List Items Addressed This Visit       Cardiovascular and Mediastinum   Essential hypertension  bp in fair control at this time -improved  BP Readings from Last 1 Encounters:  01/01/25 133/80   Plan to continue Hydrochlorothiazide  25 mg daily  Losartan  50 mg daily   No changes needed If blood pressure trends up , would consider change to different arb Most recent labs reviewed  Disc lifstyle change with low sodium diet and exercise          Endocrine   Type 2 diabetes mellitus in patient with obesity (HCC) - Primary   Lab Results  Component Value Date   HGBA1C 5.7 (A) 01/01/2025   HGBA1C 6.4 (A) 09/29/2024   HGBA1C 7.3 (H) 03/22/2024   Lab Results  Component Value Date   MICROALBUR <0.7 09/29/2024     Continues moujjaro-to increase to 10 mg weekly later this week Metformin  xr 500 mg bid   Doing well  Also with weight loss  Normal foot exam  Planning his eye exam  On arb and statin   Follow up 3 mo      Relevant Orders   POCT glycosylated hemoglobin (Hb A1C) (Completed)   Hyperlipidemia associated with type 2 diabetes mellitus (HCC)   Disc goals for lipids and reasons to control them Rev last labs with pt Rev low sat fat diet in detail Tolerating crestor  5 mg daily   LDL of 56 at goal HDL low despite good exercise         Other   Morbid obesity (HCC)   Losing weight with help of glp-1 for DM  Bmi down to 38.92 Co morbidities of HTN, DM2, hyperlipidemia -all in good control Discussed how this problem influences overall health and the risks it imposes  Reviewed plan for weight loss with lower calorie  diet (via better food choices (lower glycemic and portion control) along with exercise building up to or more than 30 minutes 5 days per week including some aerobic activity and strength training    Encouraged to keep up the good work- happy with progress       Encounter for diabetic foot exam (HCC)   Normal exam Good foot care           [1]  Social History Tobacco Use   Smoking status: Former    Current packs/day: 0.00    Types: Cigarettes    Quit date: 12/28/2000    Years since quitting: 24.0   Smokeless tobacco: Never  Vaping Use   Vaping status: Never Used  Substance Use Topics   Alcohol use: No    Alcohol/week: 0.0 standard drinks of alcohol   Drug use: No  [2]  Allergies Allergen Reactions   Escitalopram Oxalate     REACTION: sedation   Prozac  [Fluoxetine  Hcl]     Wt gain   [3]  Current Outpatient Medications on File Prior to Visit  Medication Sig Dispense Refill   albuterol  (VENTOLIN  HFA) 108 (90 Base) MCG/ACT inhaler Inhale 2 puffs into the lungs every 4 (four) hours as needed. 54 g 1   allopurinol  (ZYLOPRIM ) 100 MG tablet Take 1 tablet (100 mg total) by mouth daily. 90 tablet 3   colchicine  0.6 MG tablet Take 1 tablet (0.6 mg total) by mouth 2 (two) times daily as needed (for gout flare). 30 tablet 3   fluticasone  (FLONASE ) 50 MCG/ACT nasal spray USE 2 SPRAYS IN EACH NOSTRIL EVERY DAY 48 g 3   hydrochlorothiazide  (HYDRODIURIL ) 25 MG tablet Take 1 tablet (25 mg total) by mouth daily. 90 tablet 3   lansoprazole  (  PREVACID ) 30 MG capsule Take 1 capsule (30 mg total) by mouth daily. 90 capsule 3   losartan  (COZAAR ) 50 MG tablet Take 1 tablet (50 mg total) by mouth daily. 90 tablet 3   metFORMIN  (GLUCOPHAGE -XR) 500 MG 24 hr tablet Take 1 tablet (500 mg total) by mouth 2 (two) times daily with a meal. 180 tablet 2   rosuvastatin  (CRESTOR ) 5 MG tablet Take 1 tablet (5 mg total) by mouth daily. 90 tablet 3   sertraline  (ZOLOFT ) 100 MG tablet Take 1 tablet (100 mg total)  by mouth daily. 90 tablet 3   tirzepatide  (MOUNJARO ) 10 MG/0.5ML Pen Inject 10 mg into the skin once a week. 2 mL 0   No current facility-administered medications on file prior to visit.   "

## 2025-01-01 NOTE — Patient Instructions (Addendum)
 Take care of yourself   Keep working on healthy diet /exercise and weight loss  Let us  know if this dose of mounjaro  gives you any problems   Keep strength training   Get your eye exam scheduled please    Follow up in 3 months

## 2025-01-26 ENCOUNTER — Encounter: Payer: Self-pay | Admitting: Family Medicine

## 2025-01-26 MED ORDER — TIRZEPATIDE 12.5 MG/0.5ML ~~LOC~~ SOAJ: 12.5000 mg | SUBCUTANEOUS | 0 refills | Status: AC

## 2025-04-02 ENCOUNTER — Ambulatory Visit: Admitting: Family Medicine
